# Patient Record
Sex: Female | Born: 1965 | Race: White | Hispanic: No | Smoking: Current every day smoker
Health system: Southern US, Community
[De-identification: ages and names within clinical notes are randomized; demographics above are authoritative.]

## PROBLEM LIST (undated history)

## (undated) DIAGNOSIS — Z720 Tobacco use: Secondary | ICD-10-CM

## (undated) DIAGNOSIS — N951 Menopausal and female climacteric states: Secondary | ICD-10-CM

## (undated) DIAGNOSIS — N879 Dysplasia of cervix uteri, unspecified: Secondary | ICD-10-CM

## (undated) DIAGNOSIS — L900 Lichen sclerosus et atrophicus: Secondary | ICD-10-CM

## (undated) DIAGNOSIS — M8580 Other specified disorders of bone density and structure, unspecified site: Secondary | ICD-10-CM

## (undated) DIAGNOSIS — E7800 Pure hypercholesterolemia, unspecified: Secondary | ICD-10-CM

## (undated) DIAGNOSIS — N8003 Adenomyosis of the uterus: Secondary | ICD-10-CM

## (undated) DIAGNOSIS — K6289 Other specified diseases of anus and rectum: Secondary | ICD-10-CM

## (undated) HISTORY — PX: ABDOMINAL HYSTERECTOMY: SHX81

## (undated) HISTORY — DX: Other specified diseases of anus and rectum: K62.89

## (undated) HISTORY — DX: Pure hypercholesterolemia, unspecified: E78.00

## (undated) HISTORY — DX: Menopausal and female climacteric states: N95.1

## (undated) HISTORY — DX: Adenomyosis of the uterus: N80.03

## (undated) HISTORY — DX: Dysplasia of cervix uteri, unspecified: N87.9

## (undated) HISTORY — DX: Other specified disorders of bone density and structure, unspecified site: M85.80

## (undated) HISTORY — PX: OTHER SURGICAL HISTORY: SHX169

## (undated) HISTORY — PX: TUBAL LIGATION: SHX77

## (undated) HISTORY — DX: Lichen sclerosus et atrophicus: L90.0

## (undated) HISTORY — DX: Tobacco use: Z72.0

---

## 2002-12-18 ENCOUNTER — Other Ambulatory Visit: Admission: RE | Admit: 2002-12-18 | Discharge: 2002-12-18 | Payer: Self-pay | Admitting: Obstetrics and Gynecology

## 2003-05-12 ENCOUNTER — Encounter: Payer: Self-pay | Admitting: Family Medicine

## 2003-05-12 ENCOUNTER — Encounter: Admission: RE | Admit: 2003-05-12 | Discharge: 2003-05-12 | Payer: Self-pay | Admitting: Family Medicine

## 2005-03-15 ENCOUNTER — Other Ambulatory Visit: Admission: RE | Admit: 2005-03-15 | Discharge: 2005-03-15 | Payer: Self-pay | Admitting: Obstetrics and Gynecology

## 2005-03-20 ENCOUNTER — Encounter: Admission: RE | Admit: 2005-03-20 | Discharge: 2005-03-20 | Payer: Self-pay | Admitting: Obstetrics and Gynecology

## 2006-07-13 ENCOUNTER — Encounter (INDEPENDENT_AMBULATORY_CARE_PROVIDER_SITE_OTHER): Payer: Self-pay | Admitting: Specialist

## 2006-07-13 ENCOUNTER — Ambulatory Visit (HOSPITAL_COMMUNITY): Admission: RE | Admit: 2006-07-13 | Discharge: 2006-07-13 | Payer: Self-pay | Admitting: Obstetrics and Gynecology

## 2008-05-20 ENCOUNTER — Ambulatory Visit (HOSPITAL_COMMUNITY): Admission: RE | Admit: 2008-05-20 | Discharge: 2008-05-21 | Payer: Self-pay | Admitting: Obstetrics and Gynecology

## 2008-05-20 ENCOUNTER — Encounter (INDEPENDENT_AMBULATORY_CARE_PROVIDER_SITE_OTHER): Payer: Self-pay | Admitting: Obstetrics and Gynecology

## 2010-12-18 ENCOUNTER — Encounter: Payer: Self-pay | Admitting: Obstetrics and Gynecology

## 2011-04-11 NOTE — Discharge Summary (Signed)
Brandi Carr, [place]            ACCOUNT NO.:  [id number]   MEDICAL RECORD NO.:  [id number]          PATIENT TYPE:  OIB   LOCATION:  9319                          FACILITY:  [facility]   PHYSICIAN:  Juluis Mire, M.D.   DATE OF BIRTH:  March 03, 1966   DATE OF ADMISSION:  05/20/2008  DATE OF DISCHARGE:  05/21/2008                               DISCHARGE SUMMARY   PREOP DIAGNOSIS:  Adenomyosis.   DISCHARGE DIAGNOSIS:  Adenomyosis.   PROCEDURE:  Laparoscopic-assisted vaginal hysterectomy.   For complete history and physical, please see dictated note course in  the hospital.  The patient underwent laparoscopic-assisted vaginal  hysterectomy.  Pathology is pending, postop did well, discharged home on  first postop day, at that time was afebrile with stable vital signs.  She was tolerating her diet and ambulating without difficulty.  Foley  had been discontinued.  She was able to void without difficulty.  She  had minimal vaginal bleeding on exam.  All incisions were intact.  Abdomen was soft, nontender, and bowel sounds were active.  Her postop  hemoglobin was 12.6.   In terms of complications, none were encountered during her stay in the  hospital. The patient was discharged home in stable condition.   DISPOSITION:  The patient is discharged home on Tylox as needed for  pain.  She is to avoid heavy lifting, vaginal instruments, or driving a  car.  She is to watch for signs of infection.  She will call with  nausea, vomiting, increased abdominal pain, or active vaginal bleeding.  Also counseled on the signs of deep venous thrombosis and pulmonary  embolus.  Plan to follow up in the office in 1 week.      Juluis Mire, M.D.  Electronically Signed     JSM/MEDQ  D:  05/21/2008  T:  05/21/2008  Job:  [id number]

## 2011-04-11 NOTE — Op Note (Signed)
NAME:  Brandi Carr, SMART            ACCOUNT NO.:  [id number]   MEDICAL RECORD NO.:  [id number]          PATIENT TYPE:  OIB   LOCATION:  9319                          FACILITY:  [facility]   PHYSICIAN:  Juluis Mire, M.D.   DATE OF BIRTH:  March 22, 1966   DATE OF PROCEDURE:  05/20/2008  DATE OF DISCHARGE:                               OPERATIVE REPORT   PREOPERATIVE DIAGNOSIS:  Uterine adenomyosis.   POSTOPERATIVE DIAGNOSIS:  Uterine adenomyosis.   PROCEDURE:  Laparoscopic-assisted vaginal hysterectomy.   SURGEON:  Juluis Mire, MD   ASSISTANT:  Stann Mainland. Grewal, MD   ANESTHESIA:  General endotracheal.   ESTIMATED BLOOD LOSS:  400 mL.   PACKS AND DRAINS:  None.   INTRAOPERATIVE BLOOD PLACED:  None.   COMPLICATIONS:  None.   INDICATIONS:  Dictated in history and physical.   PROCEDURE:  The patient taken to the OR, placed supine position.  After  satisfactory level of general endotracheal anesthesia was obtained, the  patient was placed in a dorsal lithotomy position using the Allen  stirrups.  The abdomen, perineum, and vagina were prepped out with  Betadine.  Bladder was emptied by in-and-out catheterization.  A Hulka  tenaculum was put in place on the cervix and secured.  The patient was  draped as a sterile field.  A subumbilical incision was made with a  knife.  Incision was extended through the subcutaneous tissue.  The  fascia was entered sharply.  Peritoneum was entered sharply.  There was  no evidence of any periumbilical adhesions.  An open laparoscopic trocar  was put in place and secured.  Laparoscope was then introduced.  Abdomen  was inflated with carbon dioxide.  Visualization revealed no evidence of  injury to adjacent organs.  A 5-mm trocar was put in place in the  suprapubic area under direct visualization.  Appendix was visualized and  noted to be normal.  The upper abdomen including the liver and tip of  the gallbladder were clear.  Uterus was within upper  limits and normal  size.  Tubes and ovaries unremarkable.  She did have a simple cyst of  the left ovary.  Next, using EndoSeal, we cauterized and incised the  right utero-ovarian pedicle, right tube and mesosalpinx and the right  round ligament.  We then went to the left side, cauterized and incised  the left utero-ovarian pedicle, left tube, and mesosalpinx as well as  the left round ligament.  We had good hemostasis and freeing of the  adnexa bilaterally.   We then went vaginally at this point.  The laparoscope was removed.  The  abdomen was deflated with carbon dioxide.  The Hulka tenaculum was taken  off the cervix.  A weighted speculum was placed in the vaginal vault.  The cul-de-sac was entered sharply.  Both uterosacral ligaments were  clamped, cut and suture ligated with 0 Vicryl.  Reflection of the  vaginal mucosa anteriorly was incised and the bladder was dissected  superiorly.  Paracervical tissues were clamped, cut and suture ligated  with 0 Vicryl.  Using a clamp, cut and tie technique  of suture ligature  of 0 Vicryl, the parametrium serially separated from the sides of the  uterus.  The vesicouterine space was identified and entered sharply.  Uterus was then flipped, remaining pedicles were clamped and cut.  Uterus and cervix were passed off the operative field.  The held  pedicles were secured with free ties of 0 Vicryl.  The uterosacral  plication was stitched with 0 Vicryl was put in place and secured.  The  vaginal mucosa was closed with figure-of-eight of 0 Monocryl.  A Foley  was placed to straight drain with retrieval of adequate amount of clear  urine.   The patient's legs were repositioned.  Laparoscope was reintroduced and  abdomen was reinflated with carbon dioxide.  We irrigated the pelvis,  had minimal bleeding from the vaginal cuff brought under control with  cautery using the EndoSeal.  At this point, we had excellent hemostasis.  No evidence of injury to  adjacent organs.  Urine output remained clear  and adequate.  The abdomen was de-inflated with carbon dioxide.  All  trocars removed.  The subumbilical fascia closed with figure-of-eights  of 0 Vicryl, skin with interrupted subcuticular of 4-0 Vicryl.  The  suprapubic incision was closed with Dermabond.  The patient was taken  out of dorsal lithotomy position.  Once alert and extubated, transferred  to recovery room in good condition.  Sponge, instrument and needle count  was reported correct by circulating nurse x2.  Foley catheter remained  clear at the time of closure.      Juluis Mire, M.D.  Electronically Signed     JSM/MEDQ  D:  05/20/2008  T:  05/21/2008  Job:  [id number]

## 2011-04-11 NOTE — H&P (Signed)
NAME:  Brandi Carr, Brandi Carr            ACCOUNT NO.:  [id number]   MEDICAL RECORD NO.:  [id number]          PATIENT TYPE:  OIB   LOCATION:  9319                          FACILITY:  [facility]   PHYSICIAN:  Juluis Mire, M.D.   DATE OF BIRTH:  10-Nov-1966   DATE OF ADMISSION:  05/20/2008  DATE OF DISCHARGE:                              HISTORY & PHYSICAL   HISTORY AND PHYSICAL:  The patient is a 45 year old gravida 2, para 2  female, who presents for laparoscopic-assisted vaginal hysterectomy.   For the past 6 months to 1 year, cycles have been every 2 weeks.  She  will bleed for up to 11 days.  Her hemoglobin has been relatively  stable.  She describes her cycles as heavy, changing pads and tampons  every 2-3 hours with clots, and increasing dysmenorrhea.  She has had  previous ultrasounds.  She also had a hysteroscopy and laparoscopy in  2007 with findings suggestive of adenomyosis.  Options were discussed.  Because of tobacco use, birth control pills were contraindicated.  Discussed the use of Depo-Provera or other less aggressive surgical  management.  The patient desires to proceed with surgical removal of the  uterus.  She wants ovaries left in place.   ALLERGIES:  No known drug allergies.   MEDICATIONS:  Amitriptyline that she uses intermittently.   PAST MEDICAL HISTORY:  Usual childhood diseases.   PAST SURGICAL HISTORY:  She has had 2 vaginal deliveries.  One bilateral  tubal ligation and previous laparoscopy and hysteroscopy.   SOCIAL HISTORY:  One-pack per day tobacco use.  No alcohol use.   FAMILY HISTORY:  Noncontributory.   REVIEW OF SYSTEMS:  Noncontributory.   PHYSICAL EXAMINATION:  VITAL SIGNS:  The patient is afebrile, stable  vital signs.  HEENT:  The patient is normocephalic.  Pupils equal, round, and reactive  to light and accommodation.  Extraocular movements are intact.  Sclerae  and conjunctivae are clear.  Oropharynx is clear.  NECK:  Without thyromegaly.  BREASTS:  No discrete masses.  LUNGS:  Clear.  CARDIOVASCULAR:  Regular rhythm and rate without murmurs or gallops.  ABDOMEN:  Benign.  No mass, organomegaly, or tenderness.  PELVIC:  Normal external genitalia.  Vaginal mucosa clear.  Cervix  unremarkable.  Uterus upper limits of normal size, boggy, consistent  with adenomyosis.  Adnexa unremarkable.  EXTREMITIES:  Trace edema.  NEUROLOGICAL:  Grossly within normal limits.   IMPRESSION:  Abnormal uterine bleeding secondary to uterine adenomyosis.   PLAN:  The patient to undergo laparoscopic-assisted vaginal  hysterectomy.  The risks of surgery have been explained including the  risk of infection.  The risk of hemorrhage could require a transfusion  with the risk of AIDS or hepatitis.  The risk of injury to adjacent  organs including bladder, bowel, or ureters that could require further  exploratory surgery.  The risk of deep venous thrombosis and pulmonary  embolus.  The patient expressed understanding of indications, risks, and  alternatives.      Juluis Mire, M.D.  Electronically Signed     JSM/MEDQ  D:  05/20/2008  T:  05/21/2008  Job:  [id number]

## 2011-04-14 NOTE — H&P (Signed)
NAME:  Brandi Carr, Brandi Carr            ACCOUNT NO.:  [id number]   MEDICAL RECORD NO.:  [id number]          PATIENT TYPE:  AMB   LOCATION:  SDC                           FACILITY:  [facility]   PHYSICIAN:  Juluis Mire, M.D.   DATE OF BIRTH:  05-Feb-1966   DATE OF ADMISSION:  DATE OF DISCHARGE:                                HISTORY & PHYSICAL   The patient is a 45 year old gravida 2, para 2, female who presents for  diagnostic laparoscopy with laser standby and hysteroscopy.   In relation to the present admission, cycles have basically been regular.  She describes increasing menstrual flow and increasing discomfort.  She  underwent a saline infusion ultrasound here in the office that did reveal  evidence of adenomyosis since there were several polyp-like outgrowths in  the endometrium.  Adnexa unremarkable.  In view of this, she now presents  for diagnostic laparoscopy with laser standby and hysteroscopy.   ALLERGIES:  No known drug allergies.   MEDICATIONS:  Ibuprofen.   PAST MEDICAL HISTORY:  The usual childhood disease without any significant  sequelae.  No previous surgical history.   OBSTETRICAL HISTORY:  She has had two vaginal deliveries.   FAMILY HISTORY:  Noncontributory.   SOCIAL HISTORY:  One pack per day tobacco use, occasional alcohol use.   REVIEW OF SYSTEMS:  Noncontributory.   PHYSICAL EXAMINATION:  VITAL SIGNS:  The patient is afebrile with stable  vital signs.  HEENT:  The patient is normocephalic.  Pupils equal, round and reactive to  light and accommodation.  Extraocular movements are intact.  Sclerae and  conjunctivae clear.  Oropharynx clear.  NECK:  Without thyromegaly.  BREASTS:  No discrete masses.  LUNGS:  Clear.  CARDIAC:  Regular rhythm and rate without murmurs or gallops.  ABDOMEN:  Benign.  No mass, organomegaly or tenderness.  PELVIC:  Normal external genitalia.  Vaginal mucosa clear.  Cervix  unremarkable.  Uterus upper limits of normal size,  slightly boggy.  Adnexa  unremarkable.  EXTREMITIES:  Trace edema.  NEUROLOGIC:  Grossly within normal limits.   IMPRESSION:  1. Menorrhagia, increasing dysmenorrhea.  2. Endometrial polyps.   PLAN:  The patient will undergo diagnostic laparoscopy to evaluate the  pelvic status as well as hysteroscopy.  The risks of surgery have been  discussed, including the risk of infection; the risk of hemorrhage that  could require transfusion with the risk of AIDS or hepatitis; the risk of  injury to adjacent organs including bladder, bowel and ureters, that could  require further exploratory surgery; the risk of deep venous thrombosis and  pulmonary embolus.  The patient expressed an understanding of the  indications and risks.      Juluis Mire, M.D.  Electronically Signed     JSM/MEDQ  D:  07/13/2006  T:  07/13/2006  Job:  [id number]

## 2011-04-14 NOTE — Op Note (Signed)
NAME:  Brandi Carr, Brandi Carr            ACCOUNT NO.:  [id number]   MEDICAL RECORD NO.:  [id number]          PATIENT TYPE:  AMB   LOCATION:  SDC                           [facility]ACILITY:  [facility]   PHYSICIAN:  Juluis Mire, M.D.   DATE OF BIRTH:  1966/05/04   DATE OF PROCEDURE:  07/13/2006  DATE OF DISCHARGE:                                 OPERATIVE REPORT   PREOPERATIVE DIAGNOSIS:  Abnormal uterine bleed and pelvic pain.   POSTOPERATIVE DIAGNOSIS:  Uterine adenomyosis.   PROCEDURES:  Cervical dilatation, hysteroscopy, multiple endometrial  biopsies along with curetting, diagnostic laparoscopy.   SURGEON:  Juluis Mire, M.D.   ANESTHESIA:  General.   ESTIMATED BLOOD LOSS:  Minima.   PACKS AND DRAINS:  None.   INTRAOPERATIVE BLOOD PLACED:  None.   COMPLICATIONS:  None.   INDICATIONS:  Per dictated History and Physical.   PROCEDURES:  The patient was taken to the OR and placed in the supine  position.  __________.  General anesthesia was obtained.  The patient was  placed in the dorsal lithotomy position using the Allen stirrups.  The  abdomen, peroneum and vagina were prepped with Betadine.  The patient then  draped out for hysteroscopy.  Speculum was placed in the vaginal vault.  The  cervix __________ with a single toothed tenaculum.  Uterus sounded to 8 cm.  Cervix surgically dilated to the size of 35 Pratt dilator.  Operative  hysteroscope was introduced.  Intrauterine cavity was distended using  sorbitol.  Visualization revealed a synechia on the left side.  This was  taken down using the loupe.  Otherwise, we did not really see any  endometrial polyps.  Endometrium looked smooth and unremarkable.  Multiple  biopsies were obtained along with curetted and sent for pathologic review.  Deficits of 100 cc.  There were no signs of perforations or complications.   The __________ was then put in place.  Single toothed tenaculum and speculum  were removed from the vagina.  Bladder was  then emptied by in and out  catheterization.   Subumbilical incision made with knife.  There were needles introduced in  abdominal cavity.  The abdomen was inflated with approximately 3 liters of  CO2.  The operating laparoscope was introduced.  A 5 mm trocar had been  placed in the suprapubic area under direct visualization.  There was no  evidence of injury to adjacent organs.  Upper abdomen including liver and  gallbladder were clear.  Appendix was visualized and noted to be  unremarkable.  Uterus was enlarged consistent with possible adenomyosis,  previous bilateral tubal ligation was noted.  Both ovaries were  unremarkable.  There was no evidence of pelvic pathology, specifically, no  endometriosis or adhesions.  The procedure was terminated at this point.  While trocar was removed, subumbilical incision was closed with interrupted  subcuticular 4-0 Vicryl.  Suprapubic incision closed with Dermabond.  The  __________ retractor was then removed.  The patient was taken out of the  dorsal lithotomy position, __________ and extubated.  Transferred to the  recovery room in good condition.  Sponge,  instrument and needle count  reported as correct by circulating nurse.      Juluis Mire, M.D.  Electronically Signed     JSM/MEDQ  D:  07/13/2006  T:  07/13/2006  Job:  [id number]

## 2011-08-24 LAB — CBC
Hemoglobin: 12.6
Hemoglobin: 15
MCHC: 34.3
MCHC: 34.3
MCV: 100
MCV: 99.4
RBC: 3.67 — ABNORMAL LOW
RBC: 4.4
RDW: 13.5
WBC: 18.2 — ABNORMAL HIGH

## 2012-04-08 ENCOUNTER — Other Ambulatory Visit: Payer: Self-pay | Admitting: Obstetrics and Gynecology

## 2012-04-08 DIAGNOSIS — R928 Other abnormal and inconclusive findings on diagnostic imaging of breast: Secondary | ICD-10-CM

## 2012-04-10 ENCOUNTER — Ambulatory Visit
Admission: RE | Admit: 2012-04-10 | Discharge: 2012-04-10 | Disposition: A | Discharge status: Home / Self Care | Payer: BC Managed Care – PPO | Source: Ambulatory Visit | Attending: Obstetrics and Gynecology | Admitting: Obstetrics and Gynecology

## 2012-04-10 DIAGNOSIS — R928 Other abnormal and inconclusive findings on diagnostic imaging of breast: Secondary | ICD-10-CM

## 2012-04-11 ENCOUNTER — Other Ambulatory Visit: Payer: Self-pay

## 2015-11-23 ENCOUNTER — Ambulatory Visit: Payer: Self-pay | Admitting: Podiatry

## 2015-12-07 ENCOUNTER — Ambulatory Visit: Payer: Self-pay | Admitting: Podiatry

## 2016-08-03 ENCOUNTER — Ambulatory Visit: Payer: Self-pay | Admitting: Podiatry

## 2016-08-10 ENCOUNTER — Ambulatory Visit: Payer: Self-pay | Admitting: Podiatry

## 2016-09-07 ENCOUNTER — Ambulatory Visit: Payer: Self-pay | Admitting: Podiatry

## 2016-11-28 ENCOUNTER — Ambulatory Visit: Payer: Self-pay | Admitting: Podiatry

## 2017-10-24 DIAGNOSIS — Z23 Encounter for immunization: Secondary | ICD-10-CM | POA: Diagnosis not present

## 2017-10-24 DIAGNOSIS — F411 Generalized anxiety disorder: Secondary | ICD-10-CM | POA: Diagnosis not present

## 2017-10-24 DIAGNOSIS — F322 Major depressive disorder, single episode, severe without psychotic features: Secondary | ICD-10-CM | POA: Diagnosis not present

## 2017-10-24 DIAGNOSIS — E7800 Pure hypercholesterolemia, unspecified: Secondary | ICD-10-CM | POA: Diagnosis not present

## 2018-06-07 DIAGNOSIS — E7800 Pure hypercholesterolemia, unspecified: Secondary | ICD-10-CM | POA: Diagnosis not present

## 2018-06-07 DIAGNOSIS — Z0000 Encounter for general adult medical examination without abnormal findings: Secondary | ICD-10-CM | POA: Diagnosis not present

## 2018-06-07 DIAGNOSIS — Z1159 Encounter for screening for other viral diseases: Secondary | ICD-10-CM | POA: Diagnosis not present

## 2018-08-29 DIAGNOSIS — Z1231 Encounter for screening mammogram for malignant neoplasm of breast: Secondary | ICD-10-CM | POA: Diagnosis not present

## 2018-08-29 DIAGNOSIS — Z01419 Encounter for gynecological examination (general) (routine) without abnormal findings: Secondary | ICD-10-CM | POA: Diagnosis not present

## 2018-08-29 DIAGNOSIS — Z6831 Body mass index (BMI) 31.0-31.9, adult: Secondary | ICD-10-CM | POA: Diagnosis not present

## 2018-12-04 DIAGNOSIS — R21 Rash and other nonspecific skin eruption: Secondary | ICD-10-CM | POA: Diagnosis not present

## 2018-12-20 DIAGNOSIS — L821 Other seborrheic keratosis: Secondary | ICD-10-CM | POA: Diagnosis not present

## 2018-12-20 DIAGNOSIS — B081 Molluscum contagiosum: Secondary | ICD-10-CM | POA: Diagnosis not present

## 2018-12-20 DIAGNOSIS — L738 Other specified follicular disorders: Secondary | ICD-10-CM | POA: Diagnosis not present

## 2019-01-10 DIAGNOSIS — L308 Other specified dermatitis: Secondary | ICD-10-CM | POA: Diagnosis not present

## 2019-01-10 DIAGNOSIS — B081 Molluscum contagiosum: Secondary | ICD-10-CM | POA: Diagnosis not present

## 2019-01-27 DIAGNOSIS — F322 Major depressive disorder, single episode, severe without psychotic features: Secondary | ICD-10-CM | POA: Diagnosis not present

## 2019-01-27 DIAGNOSIS — F17200 Nicotine dependence, unspecified, uncomplicated: Secondary | ICD-10-CM | POA: Diagnosis not present

## 2019-01-27 DIAGNOSIS — F411 Generalized anxiety disorder: Secondary | ICD-10-CM | POA: Diagnosis not present

## 2019-02-07 DIAGNOSIS — L308 Other specified dermatitis: Secondary | ICD-10-CM | POA: Diagnosis not present

## 2019-02-07 DIAGNOSIS — B081 Molluscum contagiosum: Secondary | ICD-10-CM | POA: Diagnosis not present

## 2019-03-03 DIAGNOSIS — L308 Other specified dermatitis: Secondary | ICD-10-CM | POA: Diagnosis not present

## 2019-03-08 DIAGNOSIS — J019 Acute sinusitis, unspecified: Secondary | ICD-10-CM | POA: Diagnosis not present

## 2019-03-08 DIAGNOSIS — J209 Acute bronchitis, unspecified: Secondary | ICD-10-CM | POA: Diagnosis not present

## 2019-03-26 ENCOUNTER — Ambulatory Visit (INDEPENDENT_AMBULATORY_CARE_PROVIDER_SITE_OTHER): Payer: BLUE CROSS/BLUE SHIELD | Admitting: Sports Medicine

## 2019-03-26 ENCOUNTER — Other Ambulatory Visit: Payer: Self-pay

## 2019-03-26 ENCOUNTER — Encounter: Payer: Self-pay | Admitting: Sports Medicine

## 2019-03-26 ENCOUNTER — Ambulatory Visit (INDEPENDENT_AMBULATORY_CARE_PROVIDER_SITE_OTHER): Payer: BLUE CROSS/BLUE SHIELD

## 2019-03-26 VITALS — BP 108/66 | HR 96 | Temp 97.7°F | Resp 16 | Ht 63.0 in | Wt 194.0 lb

## 2019-03-26 DIAGNOSIS — M21619 Bunion of unspecified foot: Secondary | ICD-10-CM

## 2019-03-26 DIAGNOSIS — M79672 Pain in left foot: Secondary | ICD-10-CM

## 2019-03-26 DIAGNOSIS — M21612 Bunion of left foot: Secondary | ICD-10-CM

## 2019-03-26 DIAGNOSIS — M79671 Pain in right foot: Secondary | ICD-10-CM | POA: Diagnosis not present

## 2019-03-26 NOTE — Progress Notes (Signed)
Subjective: Brandi Carr is a 53 y.o. female patient who presents to office for evaluation of Right=Left bunion pain. Patient complains of progressive pain especially over the last several years in both feet that starts as pain over the bump with direct pressure and range of motion; patient now has difficulty fitting shoes comfortably and reports her feet throb and hurt even when she is just standing on them. Ranks pain 9/10 and is now interferring with daily activities.  Patient has also tried wider shoes, cushions, pads, insoles icing ibuprofen and bunion splints without relief.  Patient denies any other pedal complaints.   Patient denies a history of diabetes and is not on any blood thinners.  Patient denies any significant family history or history of arthritides.  Patient is a smoker less than half a pack per day but denies any history of difficulties with healing from previous tubal ligation and hysterectomy procedures.  Review of Systems  Musculoskeletal: Positive for myalgias.  All other systems reviewed and are negative.    There are no active problems to display for this patient.   Current Outpatient Medications on File Prior to Visit  Medication Sig Dispense Refill  . albuterol (VENTOLIN HFA) 108 (90 Base) MCG/ACT inhaler USE 2 PUFFS 4 TIMES A DAY AS NEEDED    . ALPRAZolam (XANAX) 0.25 MG tablet Take 0.25 mg by mouth 2 (two) times daily as needed.    Marland Kitchen. azithromycin (ZITHROMAX) 250 MG tablet TAKE 2 TABLETS BY MOUTH TODAY, THEN TAKE 1 TABLET DAILY FOR 4 DAYS    . buPROPion (WELLBUTRIN XL) 150 MG 24 hr tablet     . CHANTIX CONTINUING MONTH PAK 1 MG tablet     . desonide (DESOWEN) 0.05 % ointment APPLY A THIN LAYER TO AFFECTED AREAS DAILY    . fluconazole (DIFLUCAN) 150 MG tablet TAKE 1 TABLET BY MOUTH FOR ONE TIME DOSE    . nystatin ointment (MYCOSTATIN) APPLY TO THE AFFFECTED AREAS ON THE SKIN DAILY    . rosuvastatin (CRESTOR) 10 MG tablet TAKE 1 TABLET BY MOUTH DAILY ON  MONDAY, WEDNESAY, AND FRIDAY    . sertraline (ZOLOFT) 100 MG tablet      No current facility-administered medications on file prior to visit.     Allergies  Allergen Reactions  . Amoxicillin Diarrhea    Objective:  General: Alert and oriented x3 in no acute distress  Dermatology: No open lesions bilateral lower extremities, no webspace macerations, no ecchymosis bilateral, all nails x 10 are well manicured.  Vascular: Dorsalis Pedis and Posterior Tibial pedal pulses 2/4, Capillary Fill Time 3 seconds, (+) pedal hair growth bilateral, no edema bilateral lower extremities, Temperature gradient within normal limits.  Neurology: Michaell CowingGross sensation intact via light touch bilateral.  Musculoskeletal: Mild tenderness with palpation both bunion deformities equally however clinically the left bunion is much larger and compared to the right, no limitation or crepitus with range of motion, deformity reducible, tracking not trackbound, there is no 1st ray hypermobility noted bilateral. Midtarsal, Subtalar joint, and ankle joint range of motion is within normal limits. On weightbearing exam, there is decreased 1st MTPJ rom left greater than right with functional limitus noted, there is medial arch collapse on weightbearing, rearfoot slight valgus, forefoot slight abduction with HAV deformity supported on ground with no second toe crossover deformity noted.   Gait: Non-Antalgic gait with increased medial arch collapse and pronatory influence noted on left greater than right foot with medial 1st MTPJ roll-off at toe-off, heel off within  normal limits.   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  No other acute findings.      Assessment and Plan: Problem List Items Addressed This Visit    None    Visit Diagnoses    Bunion    -  Primary   Relevant Orders   DG Foot Complete Right   DG Foot Complete  Left   Foot pain, bilateral           -Complete examination performed -Xrays reviewed -Discussed treatement options; discussed HAV deformity;conservative and  Surgical management; risks, benefits, alternatives discussed. All patient's questions answered. -Patient reports that she is strongly considering surgery since she has had this for years and has tried several conservative options -Recommend continue with good supportive shoes and inserts meanwhile.  -Patient to return to office 3 to 4 weeks for further discussion and surgery consultation or sooner if condition worsens.  Advised patient that she must do 1 foot at a time and thinks that she will be a good candidate to have the left bunion corrected first followed by the right bunion later this year since her pain is so significant.  Asencion Islam, DPM

## 2019-03-26 NOTE — Patient Instructions (Signed)
Bunion  A bunion is a bump on the base of the big toe that forms when the bones of the big toe joint move out of position. Bunions may be small at first, but they often get larger over time. They can make walking painful. What are the causes? A bunion may be caused by:  Wearing narrow or pointed shoes that force the big toe to press against the other toes.  Abnormal foot development that causes the foot to roll inward (pronate).  Changes in the foot that are caused by certain diseases, such as rheumatoid arthritis or polio.  A foot injury. What increases the risk? The following factors may make you more likely to develop this condition:  Wearing shoes that squeeze the toes together.  Having certain diseases, such as: ? Rheumatoid arthritis. ? Polio. ? Cerebral palsy.  Having family members who have bunions.  Being born with a foot deformity, such as flat feet or low arches.  Doing activities that put a lot of pressure on the feet, such as ballet dancing. What are the signs or symptoms? The main symptom of a bunion is a noticeable bump on the big toe. Other symptoms may include:  Pain.  Swelling around the big toe.  Redness and inflammation.  Thick or hardened skin on the big toe or between the toes.  Stiffness or loss of motion in the big toe.  Trouble with walking. How is this diagnosed? A bunion may be diagnosed based on your symptoms, medical history, and activities. You may have tests, such as:  X-rays. These allow your health care provider to check the position of the bones in your foot and look for damage to your joint. They also help your health care provider determine the severity of your bunion and the best way to treat it.  Joint aspiration. In this test, a sample of fluid is removed from the toe joint. This test may be done if you are in a lot of pain. It helps rule out diseases that cause painful swelling of the joints, such as arthritis. How is this  treated? Treatment depends on the severity of your symptoms. The goal of treatment is to relieve symptoms and prevent the bunion from getting worse. Your health care provider may recommend:  Wearing shoes that have a wide toe box.  Using bunion pads to cushion the affected area.  Taping your toes together to keep them in a normal position.  Placing a device inside your shoe (orthotics) to help reduce pressure on your toe joint.  Taking medicine to ease pain, inflammation, and swelling.  Applying heat or ice to the affected area.  Doing stretching exercises.  Surgery to remove scar tissue and move the toes back into their normal position. This treatment is rare. Follow these instructions at home: Managing pain, stiffness, and swelling   If directed, put ice on the painful area: ? Put ice in a plastic bag. ? Place a towel between your skin and the bag. ? Leave the ice on for 20 minutes, 2-3 times a day. Activity   If directed, apply heat to the affected area before you exercise. Use the heat source that your health care provider recommends, such as a moist heat pack or a heating pad. ? Place a towel between your skin and the heat source. ? Leave the heat on for 20-30 minutes. ? Remove the heat if your skin turns bright red. This is especially important if you are unable to feel pain,   heat, or cold. You may have a greater risk of getting burned.  Do exercises as told by your health care provider. General instructions  Support your toe joint with proper footwear, shoe padding, or taping as told by your health care provider.  Take over-the-counter and prescription medicines only as told by your health care provider.  Keep all follow-up visits as told by your health care provider. This is important. Contact a health care provider if your symptoms:  Get worse.  Do not improve in 2 weeks. Get help right away if you have:  Severe pain and trouble with walking. Summary  A  bunion is a bump on the base of the big toe that forms when the bones of the big toe joint move out of position.  Bunions can make walking painful.  Treatment depends on the severity of your symptoms.  Support your toe joint with proper footwear, shoe padding, or taping as told by your health care provider. This information is not intended to replace advice given to you by your health care provider. Make sure you discuss any questions you have with your health care provider. Document Released: 11/13/2005 Document Revised: 03/26/2018 Document Reviewed: 03/26/2018 Elsevier Interactive Patient Education  2019 Elsevier Inc.  

## 2019-03-26 NOTE — Progress Notes (Signed)
   Subjective:    Patient ID: Brandi Carr, female    DOB: 1966-08-01, 52 y.o.   MRN: [id number]  HPI    Review of Systems  Musculoskeletal: Positive for myalgias.  All other systems reviewed and are negative.      Objective:   Physical Exam        Assessment & Plan:

## 2019-03-27 ENCOUNTER — Ambulatory Visit (INDEPENDENT_AMBULATORY_CARE_PROVIDER_SITE_OTHER): Payer: BLUE CROSS/BLUE SHIELD

## 2019-03-27 DIAGNOSIS — M21611 Bunion of right foot: Secondary | ICD-10-CM

## 2019-04-17 ENCOUNTER — Ambulatory Visit (INDEPENDENT_AMBULATORY_CARE_PROVIDER_SITE_OTHER): Payer: BLUE CROSS/BLUE SHIELD | Admitting: Sports Medicine

## 2019-04-17 ENCOUNTER — Encounter: Payer: Self-pay | Admitting: Sports Medicine

## 2019-04-17 ENCOUNTER — Other Ambulatory Visit: Payer: Self-pay

## 2019-04-17 VITALS — Temp 97.3°F | Resp 16

## 2019-04-17 DIAGNOSIS — M79672 Pain in left foot: Secondary | ICD-10-CM | POA: Diagnosis not present

## 2019-04-17 DIAGNOSIS — M21619 Bunion of unspecified foot: Secondary | ICD-10-CM | POA: Diagnosis not present

## 2019-04-17 NOTE — Patient Instructions (Signed)
Pre-Operative Instructions  Congratulations, you have decided to take an important step towards improving your quality of life.  You can be assured that the doctors and staff at [facility] will be with you every step of the way.  Here are some important things you should know:  1. Plan to be at the surgery center/hospital at least 1 (one) hour prior to your scheduled time, unless otherwise directed by the surgical center/hospital staff.  You must have a responsible adult accompany you, remain during the surgery and drive you home.  Make sure you have directions to the surgical center/hospital to ensure you arrive on time. 2. If you are having surgery at [facility] or  hospitals, you will need a copy of your medical history and physical form from your family physician within one month prior to the date of surgery. We will give you a form for your primary physician to complete.  3. We make every effort to accommodate the date you request for surgery.  However, there are times where surgery dates or times have to be moved.  We will contact you as soon as possible if a change in schedule is required.   4. No aspirin/ibuprofen for one week before surgery.  If you are on aspirin, any non-steroidal anti-inflammatory medications (Mobic, Aleve, Ibuprofen) should not be taken seven (7) days prior to your surgery.  You make take Tylenol for pain prior to surgery.  5. Medications - If you are taking daily heart and blood pressure medications, seizure, reflux, allergy, asthma, anxiety, pain or diabetes medications, make sure you notify the surgery center/hospital before the day of surgery so they can tell you which medications you should take or avoid the day of surgery. 6. No food or drink after midnight the night before surgery unless directed otherwise by surgical center/hospital staff. 7. No alcoholic beverages 24-hours prior to surgery.  No smoking 24-hours prior or 24-hours after  surgery. 8. Wear loose pants or shorts. They should be loose enough to fit over bandages, boots, and casts. 9. Don't wear slip-on shoes. Sneakers are preferred. 10. Bring your boot with you to the surgery center/hospital.  Also bring crutches or a walker if your physician has prescribed it for you.  If you do not have this equipment, it will be provided for you after surgery. 11. If you have not been contacted by the surgery center/hospital by the day before your surgery, call to confirm the date and time of your surgery. 12. Leave-time from work may vary depending on the type of surgery you have.  Appropriate arrangements should be made prior to surgery with your employer. 13. Prescriptions will be provided immediately following surgery by your doctor.  Fill these as soon as possible after surgery and take the medication as directed. Pain medications will not be refilled on weekends and must be approved by the doctor. 14. Remove nail polish on the operative foot and avoid getting pedicures prior to surgery. 15. Wash the night before surgery.  The night before surgery wash the foot and leg well with water and the antibacterial soap provided. Be sure to pay special attention to beneath the toenails and in between the toes.  Wash for at least three (3) minutes. Rinse thoroughly with water and dry well with a towel.  Perform this wash unless told not to do so by your physician.  Enclosed: 1 Ice pack (please put in freezer the night before surgery)   1 Hibiclens skin cleaner     Pre-op instructions  If you have any questions regarding the instructions, please do not hesitate to call our office.  [place]: [address], [place], [place] [address] -- [phone]  [place]: [address] [place], [place] [address] -- [phone]  [place]: [address]  [place], [place] [address] -- [phone]  [place]: [address], [address], [place], [place] [address] -- [phone]  Website:  [email or web address] 

## 2019-04-17 NOTE — Progress Notes (Signed)
Subjective: Brandi Carr is a 53 y.o. female patient who returns to office for follow up evaluation of Right=Left bunion pain. Patient states that the pain is the same at the bunion wose with standing and after work, pain still is  9/10 with use of wider shoes and insoles. Patient denies any other pedal complaints. Denies any changes with medical history since last visit.   There are no active problems to display for this patient.   Current Outpatient Medications on File Prior to Visit  Medication Sig Dispense Refill  . albuterol (VENTOLIN HFA) 108 (90 Base) MCG/ACT inhaler USE 2 PUFFS 4 TIMES A DAY AS NEEDED    . ALPRAZolam (XANAX) 0.25 MG tablet Take 0.25 mg by mouth 2 (two) times daily as needed.    Marland Kitchen azithromycin (ZITHROMAX) 250 MG tablet TAKE 2 TABLETS BY MOUTH TODAY, THEN TAKE 1 TABLET DAILY FOR 4 DAYS    . buPROPion (WELLBUTRIN XL) 150 MG 24 hr tablet     . CHANTIX CONTINUING MONTH PAK 1 MG tablet     . desonide (DESOWEN) 0.05 % ointment APPLY A THIN LAYER TO AFFECTED AREAS DAILY    . fluconazole (DIFLUCAN) 150 MG tablet TAKE 1 TABLET BY MOUTH FOR ONE TIME DOSE    . nystatin ointment (MYCOSTATIN) APPLY TO THE AFFFECTED AREAS ON THE SKIN DAILY    . rosuvastatin (CRESTOR) 10 MG tablet TAKE 1 TABLET BY MOUTH DAILY ON MONDAY, WEDNESAY, AND FRIDAY    . sertraline (ZOLOFT) 100 MG tablet      No current facility-administered medications on file prior to visit.     Allergies  Allergen Reactions  . Amoxicillin Diarrhea    History reviewed. No pertinent family history.  Social History   Socioeconomic History  . Marital status: Legally Separated    Spouse name: Not on file  . Number of children: Not on file  . Years of education: Not on file  . Highest education level: Not on file  Occupational History  . Not on file  Social Needs  . Financial resource strain: Not on file  . Food insecurity:    Worry: Not on file    Inability: Not on file  . Transportation needs:   Medical: Not on file    Non-medical: Not on file  Tobacco Use  . Smoking status: Current Every Day Smoker    Packs/day: 0.50    Types: Cigarettes  . Smokeless tobacco: Never Used  Substance and Sexual Activity  . Alcohol use: Not on file  . Drug use: Not on file  . Sexual activity: Not on file  Lifestyle  . Physical activity:    Days per week: Not on file    Minutes per session: Not on file  . Stress: Not on file  Relationships  . Social connections:    Talks on phone: Not on file    Gets together: Not on file    Attends religious service: Not on file    Active member of club or organization: Not on file    Attends meetings of clubs or organizations: Not on file    Relationship status: Not on file  Other Topics Concern  . Not on file  Social History Narrative  . Not on file    History reviewed. No pertinent surgical history.  Objective:  General: Alert and oriented x3 in no acute distress  Dermatology: No open lesions bilateral lower extremities, no webspace macerations, no ecchymosis bilateral, all nails x 10 are well  manicured.  Vascular: Dorsalis Pedis and Posterior Tibial pedal pulses 2/4, Capillary Fill Time 3 seconds, (+) pedal hair growth bilateral, no edema bilateral lower extremities, Temperature gradient within normal limits.  Neurology: Gross sensation intact via light touch bilateral.  Musculoskeletal: Mild tenderness with palpation both bunion deformities equally however clinically the left bunion is much larger and compared to the right as previous noted, no limitation or crepitus with range of motion, deformity reducible, tracking not trackbound, there is no 1st ray hypermobility noted bilateral. Midtarsal, Subtalar joint, and ankle joint range of motion is within normal limits. 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   Assessment and Plan: Problem List Items Addressed This Visit    None    Visit Diagnoses    Bunion    -  Primary   Left foot pain          -Complete examination performed -Previous x-rays reviewed -re-Discussed treatement options; discussed HAV deformity;conservative and  Surgical management; risks, benefits, alternatives discussed. All patient's questions answered. -Patient opt for surgical management. Consent obtained for Left Mariel KanskyKalish Akin bunionectomy with internal screw fixation. Pre and Post op course explained. Risks, benefits, alternatives explained. No guarantees given or implied. Surgical booking slip submitted and provided patient with Surgical packet and info for [facility].  -To dispense wedge shoe post op  -Rx knee scooter -Patient to be NWB 4 weeks -Patient to be out of work on [employer] for 12 weeks.  -Patient to return to office after surgery or sooner if problems arise. Asencion Islamitorya Aleyza Salmi, DPM

## 2019-04-18 ENCOUNTER — Telehealth: Payer: Self-pay | Admitting: *Deleted

## 2019-04-18 NOTE — Telephone Encounter (Signed)
"  Dr. Marylene Land told me to give you a call to schedule my surgery."  Dr. Marylene Land does surgeries on Mondays.  Do you have a date you would like?  "I'd like to do it on June 22 or in July."  She can do it on May 19, 2019.  I'll get it scheduled.  Someone from the surgical center will give you a call the Friday before your surgery date.  They will give you your arrival time.  You need to register with the surgical center online, instructions are in the brochure that we gave you.

## 2019-05-12 ENCOUNTER — Telehealth: Payer: Self-pay | Admitting: Sports Medicine

## 2019-05-12 DIAGNOSIS — M21619 Bunion of unspecified foot: Secondary | ICD-10-CM

## 2019-05-12 NOTE — Telephone Encounter (Signed)
Patient would let to know about getting a knee scooter before her surgery.sto

## 2019-05-12 NOTE — Telephone Encounter (Signed)
Mel Order Knee Scooter for patient Thanks Dr. Cannon Kettle

## 2019-05-13 ENCOUNTER — Telehealth: Payer: Self-pay | Admitting: *Deleted

## 2019-05-13 NOTE — Telephone Encounter (Signed)
DOS 05/19/2019; CPT CODE: 53299 - DOUBLE OSTEOTOMY LEFT FOOT  BCBS: Effective Date - 11/27/2017 - 11/26/9998  In-Network    Max Per Benefit Period Year-to-Date Remaining  CoInsurance     Deductible  $2,000.00 [phone]  Out-Of-Pocket  $3,000.00 [phone]   In-Network Individual  [employer] Coinsurance  Not Applicable  41%

## 2019-05-14 NOTE — Addendum Note (Signed)
Addended by: Johnnye Lana A on: 05/14/2019 03:32 PM   Modules accepted: Orders

## 2019-05-18 ENCOUNTER — Other Ambulatory Visit: Payer: Self-pay | Admitting: Sports Medicine

## 2019-05-18 DIAGNOSIS — Z9889 Other specified postprocedural states: Secondary | ICD-10-CM

## 2019-05-18 NOTE — Progress Notes (Signed)
Post op meds entered  -Dr. Roma Bierlein 

## 2019-05-19 DIAGNOSIS — M21612 Bunion of left foot: Secondary | ICD-10-CM | POA: Diagnosis not present

## 2019-05-19 DIAGNOSIS — M2042 Other hammer toe(s) (acquired), left foot: Secondary | ICD-10-CM | POA: Diagnosis not present

## 2019-05-19 DIAGNOSIS — M25572 Pain in left ankle and joints of left foot: Secondary | ICD-10-CM | POA: Diagnosis not present

## 2019-05-19 DIAGNOSIS — E7800 Pure hypercholesterolemia, unspecified: Secondary | ICD-10-CM | POA: Diagnosis not present

## 2019-05-19 DIAGNOSIS — M205X2 Other deformities of toe(s) (acquired), left foot: Secondary | ICD-10-CM | POA: Diagnosis not present

## 2019-05-19 DIAGNOSIS — M2012 Hallux valgus (acquired), left foot: Secondary | ICD-10-CM | POA: Diagnosis not present

## 2019-05-19 MED ORDER — IBUPROFEN 800 MG PO TABS: 800.0000 mg | ORAL_TABLET | Freq: Three times a day (TID) | ORAL | 0 refills | Status: DC | PRN

## 2019-05-19 MED ORDER — HYDROCODONE-ACETAMINOPHEN 10-325 MG PO TABS: 1.0000 | ORAL_TABLET | Freq: Four times a day (QID) | ORAL | 0 refills | Status: AC | PRN

## 2019-05-19 MED ORDER — DOCUSATE SODIUM 100 MG PO CAPS: 100.0000 mg | ORAL_CAPSULE | Freq: Two times a day (BID) | ORAL | 0 refills | Status: DC

## 2019-05-19 MED ORDER — PROMETHAZINE HCL 25 MG PO TABS: 25.0000 mg | ORAL_TABLET | Freq: Three times a day (TID) | ORAL | 0 refills | Status: DC | PRN

## 2019-05-20 ENCOUNTER — Encounter: Payer: Self-pay | Admitting: Sports Medicine

## 2019-05-20 ENCOUNTER — Telehealth: Payer: Self-pay | Admitting: Sports Medicine

## 2019-05-20 ENCOUNTER — Telehealth: Payer: Self-pay | Admitting: *Deleted

## 2019-05-20 DIAGNOSIS — M21612 Bunion of left foot: Secondary | ICD-10-CM | POA: Diagnosis not present

## 2019-05-20 DIAGNOSIS — M79672 Pain in left foot: Secondary | ICD-10-CM | POA: Diagnosis not present

## 2019-05-20 NOTE — Telephone Encounter (Signed)
Post op phone call made to patient. Patient reports that she had a rough night. The pain medication was not helping at 1st. Patient reports that she is having a hard time keeping the ice pack on her foot. I advised patient to ice behind knee, continue with pain regimein, and elevate. I advised patient that is it normal for the foot to be"waking up" and feeling less numb. I advised to continue with Nonweightbearing and that we will follow up on her scooter order. Patient thanked me for my call. -Dr. Cannon Kettle

## 2019-05-20 NOTE — Telephone Encounter (Signed)
Patient called to check status of knee scooter was able to relay the information I had received.  She then stated that she believes she is having an allergic reaction to the pain medication she is itching all over and has hives.  I told patient to not take any more pain med and to start Benadryl and increased fluids patient stated understanding.  Will advise of Dr. Leeanne Rio directions.

## 2019-05-20 NOTE — Telephone Encounter (Signed)
Your instructions are correct she has motrin 800 that she can take and if pain is not relieved with motrin we can change to a different medication _Dr Cannon Kettle

## 2019-05-21 ENCOUNTER — Other Ambulatory Visit: Payer: Self-pay | Admitting: Sports Medicine

## 2019-05-21 MED ORDER — HYDROMORPHONE HCL 4 MG PO TABS: 4.0000 mg | ORAL_TABLET | Freq: Four times a day (QID) | ORAL | 0 refills | Status: AC | PRN

## 2019-05-21 NOTE — Telephone Encounter (Signed)
I have sent and Rx for Dilaudid to her pharmacy to have her to pick up to use in place of the Norco to see if she reacts better to this medication for pain control. -Dr Cannon Kettle

## 2019-05-21 NOTE — Progress Notes (Signed)
Changed Norco to Diladud due to allergic reaction of itching and hives -Dr. Cannon Kettle

## 2019-05-21 NOTE — Telephone Encounter (Signed)
Patient called back and stated that she is taking ibuprofen and benadryl as instructed but is still very uncomfortable would like to know if there is something else she can take

## 2019-05-28 ENCOUNTER — Ambulatory Visit (INDEPENDENT_AMBULATORY_CARE_PROVIDER_SITE_OTHER): Payer: Self-pay | Admitting: Sports Medicine

## 2019-05-28 ENCOUNTER — Ambulatory Visit (INDEPENDENT_AMBULATORY_CARE_PROVIDER_SITE_OTHER): Payer: BC Managed Care – PPO

## 2019-05-28 ENCOUNTER — Encounter: Payer: Self-pay | Admitting: Sports Medicine

## 2019-05-28 ENCOUNTER — Other Ambulatory Visit: Payer: Self-pay | Admitting: Sports Medicine

## 2019-05-28 ENCOUNTER — Other Ambulatory Visit: Payer: Self-pay

## 2019-05-28 VITALS — Temp 99.1°F | Resp 16

## 2019-05-28 DIAGNOSIS — M21612 Bunion of left foot: Secondary | ICD-10-CM

## 2019-05-28 DIAGNOSIS — Z9889 Other specified postprocedural states: Secondary | ICD-10-CM

## 2019-05-28 DIAGNOSIS — M79672 Pain in left foot: Secondary | ICD-10-CM

## 2019-05-28 DIAGNOSIS — M21619 Bunion of unspecified foot: Secondary | ICD-10-CM

## 2019-05-28 MED ORDER — IBUPROFEN 800 MG PO TABS: 800.0000 mg | ORAL_TABLET | Freq: Three times a day (TID) | ORAL | 0 refills | Status: DC | PRN

## 2019-05-29 ENCOUNTER — Encounter: Payer: Self-pay | Admitting: Sports Medicine

## 2019-05-29 NOTE — Progress Notes (Signed)
Subjective: Brandi Carr is a 53 y.o. female patient seen today in office for POV #1 (DOS 05/19/2019), S/P left [name] shaking bunionectomy with internal fixation. Patient admits 4 out of 10 pain at surgical site with some numbness to the tip of the toe, denies calf pain, denies headache, chest pain, shortness of breath, nausea, vomiting, fever, or chills. Patient states that she is doing well and is only taking Ibuprofen for now and Dilaudid only took 1 reports that the itching has slowly gotten better after stopping the hydrocodone however still has some baseline itching that she is taking Benadryl for. No other issues noted.   There are no active problems to display for this patient.   Current Outpatient Medications on File Prior to Visit  Medication Sig Dispense Refill  . albuterol (VENTOLIN HFA) 108 (90 Base) MCG/ACT inhaler USE 2 PUFFS 4 TIMES A DAY AS NEEDED    . ALPRAZolam (XANAX) 0.25 MG tablet Take 0.25 mg by mouth 2 (two) times daily as needed.    Marland Kitchen azithromycin (ZITHROMAX) 250 MG tablet TAKE 2 TABLETS BY MOUTH TODAY, THEN TAKE 1 TABLET DAILY FOR 4 DAYS    . buPROPion (WELLBUTRIN XL) 150 MG 24 hr tablet     . CHANTIX CONTINUING MONTH PAK 1 MG tablet     . desonide (DESOWEN) 0.05 % ointment APPLY A THIN LAYER TO AFFECTED AREAS DAILY    . docusate sodium (COLACE) 100 MG capsule Take 1 capsule (100 mg total) by mouth 2 (two) times daily. 10 capsule 0  . fluconazole (DIFLUCAN) 150 MG tablet TAKE 1 TABLET BY MOUTH FOR ONE TIME DOSE    . nystatin ointment (MYCOSTATIN) APPLY TO THE AFFFECTED AREAS ON THE SKIN DAILY    . promethazine (PHENERGAN) 25 MG tablet Take 1 tablet (25 mg total) by mouth every 8 (eight) hours as needed for nausea or vomiting. 20 tablet 0  . rosuvastatin (CRESTOR) 10 MG tablet TAKE 1 TABLET BY MOUTH DAILY ON MONDAY, [place], AND FRIDAY    . sertraline (ZOLOFT) 100 MG tablet      No current facility-administered medications on file prior to visit.     Allergies   Allergen Reactions  . Amoxicillin Diarrhea    Objective: There were no vitals filed for this visit.  General: No acute distress, AAOx3  Left foot: Sutures intact with no gapping or dehiscence at surgical site, mild swelling to left forefoot, no erythema, no warmth, no drainage, no signs of infection noted, Capillary fill time <3 seconds in all digits, gross sensation present via light touch to left foot.  No pain or crepitation with range of motion left foot.  No pain with calf compression.   Post Op Xray, left foot: First toe and metatarsal in excellent alignment and position. Osteotomy site healing. Hardware intact. Soft tissue swelling within normal limits for post op status.   Assessment and Plan:  Problem List Items Addressed This Visit    None    Visit Diagnoses    S/P foot surgery, left    -  Primary   Bunion       Left foot pain           -Patient seen and evaluated -Applied dry sterile dressing to surgical site left foot secured with ACE wrap and stockinet  -Advised patient to make sure to keep dressings clean, dry, and intact to left surgical site and may adjust Ace wrap as needed -Advised patient to continue with wedge postop shoe and  knee scooter nonweightbearing to the left forefoot -Advised patient to limit activity to necessity  -Advised patient to ice and elevate as instructed -Continue with Motrin as needed for pain relief; refilled at today's visit -Continue with Benadryl as needed for itching advised patient that instead of it being a reaction to the pain medication it likely could have been a reaction after anesthesia -Will plan for possible suture removal at next office visit. In the meantime, patient to call office if any issues or problems arise.   Asencion Islamitorya Alicen Donalson, DPM

## 2019-06-03 ENCOUNTER — Other Ambulatory Visit: Payer: Self-pay | Admitting: Sports Medicine

## 2019-06-03 ENCOUNTER — Other Ambulatory Visit: Payer: BLUE CROSS/BLUE SHIELD

## 2019-06-03 DIAGNOSIS — M21619 Bunion of unspecified foot: Secondary | ICD-10-CM

## 2019-06-03 DIAGNOSIS — Z9889 Other specified postprocedural states: Secondary | ICD-10-CM

## 2019-06-04 ENCOUNTER — Encounter: Payer: Self-pay | Admitting: Sports Medicine

## 2019-06-04 ENCOUNTER — Encounter: Payer: BLUE CROSS/BLUE SHIELD | Admitting: Sports Medicine

## 2019-06-04 ENCOUNTER — Ambulatory Visit (INDEPENDENT_AMBULATORY_CARE_PROVIDER_SITE_OTHER): Payer: Self-pay | Admitting: Sports Medicine

## 2019-06-04 ENCOUNTER — Other Ambulatory Visit: Payer: Self-pay

## 2019-06-04 VITALS — Temp 96.9°F | Resp 16

## 2019-06-04 DIAGNOSIS — M21619 Bunion of unspecified foot: Secondary | ICD-10-CM

## 2019-06-04 DIAGNOSIS — M79672 Pain in left foot: Secondary | ICD-10-CM

## 2019-06-04 DIAGNOSIS — Z9889 Other specified postprocedural states: Secondary | ICD-10-CM

## 2019-06-04 NOTE — Progress Notes (Signed)
Subjective: Brandi Carr is a 53 y.o. female patient seen today in office for POV #2 (DOS 05/19/2019), S/P left Kalish-akin bunionectomy with internal fixation. Patient admits shooting and spasm pain, denies calf pain, denies headache, chest pain, shortness of breath, nausea, vomiting, fever, or chills. Patient states that she is doing well and is only taking Ibuprofen. No other issues noted.   There are no active problems to display for this patient.   Current Outpatient Medications on File Prior to Visit  Medication Sig Dispense Refill  . albuterol (VENTOLIN HFA) 108 (90 Base) MCG/ACT inhaler USE 2 PUFFS 4 TIMES A DAY AS NEEDED    . ALPRAZolam (XANAX) 0.25 MG tablet Take 0.25 mg by mouth 2 (two) times daily as needed.    Marland Kitchen azithromycin (ZITHROMAX) 250 MG tablet TAKE 2 TABLETS BY MOUTH TODAY, THEN TAKE 1 TABLET DAILY FOR 4 DAYS    . buPROPion (WELLBUTRIN XL) 150 MG 24 hr tablet     . CHANTIX CONTINUING MONTH PAK 1 MG tablet     . desonide (DESOWEN) 0.05 % ointment APPLY A THIN LAYER TO AFFECTED AREAS DAILY    . docusate sodium (COLACE) 100 MG capsule Take 1 capsule (100 mg total) by mouth 2 (two) times daily. 10 capsule 0  . fluconazole (DIFLUCAN) 150 MG tablet TAKE 1 TABLET BY MOUTH FOR ONE TIME DOSE    . ibuprofen (ADVIL) 800 MG tablet Take 1 tablet (800 mg total) by mouth every 8 (eight) hours as needed. For mild pain or for pain unrelieved by pain medication/break through pain 30 tablet 0  . nystatin ointment (MYCOSTATIN) APPLY TO THE AFFFECTED AREAS ON THE SKIN DAILY    . promethazine (PHENERGAN) 25 MG tablet Take 1 tablet (25 mg total) by mouth every 8 (eight) hours as needed for nausea or vomiting. 20 tablet 0  . rosuvastatin (CRESTOR) 10 MG tablet TAKE 1 TABLET BY MOUTH DAILY ON MONDAY, [place], AND FRIDAY    . sertraline (ZOLOFT) 100 MG tablet      No current facility-administered medications on file prior to visit.     Allergies  Allergen Reactions  . Amoxicillin Diarrhea     Objective: There were no vitals filed for this visit.  General: No acute distress, AAOx3  Left foot: Sutures intact with minimal gapping but no dehiscence at surgical site, mild swelling to left forefoot, no erythema, no warmth, no drainage, no signs of infection noted, Capillary fill time <3 seconds in all digits, gross sensation present via light touch to left foot.  No pain or crepitation with range of motion left foot.  No pain with calf compression.   Assessment and Plan:  Problem List Items Addressed This Visit    None    Visit Diagnoses    S/P foot surgery, left    -  Primary   Bunion       Left foot pain           -Patient seen and evaluated -Applied dry sterile dressing to surgical site left foot secured with ACE wrap and stockinet  -Advised patient to make sure to keep dressings clean, dry, and intact to left surgical site and may adjust Ace wrap as needed for this week and may change dressing next week since it will be 2 weeks before I see her again since she wants to spend a week with her daughter of which I ok'd as long as she can take her scooter and shoe -Advised patient to continue  with wedge postop shoe and knee scooter nonweightbearing to the left forefoot -Advised patient to limit activity to necessity  -Advised patient to ice and elevate as instructed -Continue with Motrin as needed for pain relief -Will plan for xray, suture removal, and weightbearing with CAM boot at next office visit. In the meantime, patient to call office if any issues or problems arise.   Asencion Islamitorya Farris Blash, DPM

## 2019-06-19 ENCOUNTER — Other Ambulatory Visit: Payer: Self-pay

## 2019-06-19 ENCOUNTER — Ambulatory Visit (INDEPENDENT_AMBULATORY_CARE_PROVIDER_SITE_OTHER): Payer: BC Managed Care – PPO

## 2019-06-19 ENCOUNTER — Encounter: Payer: Self-pay | Admitting: Sports Medicine

## 2019-06-19 ENCOUNTER — Ambulatory Visit (INDEPENDENT_AMBULATORY_CARE_PROVIDER_SITE_OTHER): Payer: BC Managed Care – PPO | Admitting: Sports Medicine

## 2019-06-19 ENCOUNTER — Other Ambulatory Visit: Payer: Self-pay | Admitting: Sports Medicine

## 2019-06-19 VITALS — Temp 98.1°F | Resp 16

## 2019-06-19 DIAGNOSIS — Z9889 Other specified postprocedural states: Secondary | ICD-10-CM

## 2019-06-19 DIAGNOSIS — M21619 Bunion of unspecified foot: Secondary | ICD-10-CM

## 2019-06-19 DIAGNOSIS — M21612 Bunion of left foot: Secondary | ICD-10-CM | POA: Diagnosis not present

## 2019-06-19 DIAGNOSIS — M79672 Pain in left foot: Secondary | ICD-10-CM

## 2019-06-19 NOTE — Progress Notes (Signed)
Subjective: Brandi Carr is a 53 y.o. female patient seen today in office for POV #3 (DOS 05/19/2019), S/P left Kalish-akin bunionectomy with internal fixation. Patient reports that she has decreased spasms and shooting pain, pain 2-3 out of 10 denies calf pain, denies headache, chest pain, shortness of breath, nausea, vomiting, fever, or chills. No other issues noted.   There are no active problems to display for this patient.   Current Outpatient Medications on File Prior to Visit  Medication Sig Dispense Refill  . albuterol (VENTOLIN HFA) 108 (90 Base) MCG/ACT inhaler USE 2 PUFFS 4 TIMES A DAY AS NEEDED    . ALPRAZolam (XANAX) 0.25 MG tablet Take 0.25 mg by mouth 2 (two) times daily as needed.    Marland Kitchen azithromycin (ZITHROMAX) 250 MG tablet TAKE 2 TABLETS BY MOUTH TODAY, THEN TAKE 1 TABLET DAILY FOR 4 DAYS    . buPROPion (WELLBUTRIN XL) 150 MG 24 hr tablet     . CHANTIX CONTINUING MONTH PAK 1 MG tablet     . desonide (DESOWEN) 0.05 % ointment APPLY A THIN LAYER TO AFFECTED AREAS DAILY    . docusate sodium (COLACE) 100 MG capsule Take 1 capsule (100 mg total) by mouth 2 (two) times daily. 10 capsule 0  . fluconazole (DIFLUCAN) 150 MG tablet TAKE 1 TABLET BY MOUTH FOR ONE TIME DOSE    . ibuprofen (ADVIL) 800 MG tablet Take 1 tablet (800 mg total) by mouth every 8 (eight) hours as needed. For mild pain or for pain unrelieved by pain medication/break through pain 30 tablet 0  . nystatin ointment (MYCOSTATIN) APPLY TO THE AFFFECTED AREAS ON THE SKIN DAILY    . promethazine (PHENERGAN) 25 MG tablet Take 1 tablet (25 mg total) by mouth every 8 (eight) hours as needed for nausea or vomiting. 20 tablet 0  . rosuvastatin (CRESTOR) 10 MG tablet TAKE 1 TABLET BY MOUTH DAILY ON MONDAY, [place], AND FRIDAY    . sertraline (ZOLOFT) 100 MG tablet      No current facility-administered medications on file prior to visit.     Allergies  Allergen Reactions  . Amoxicillin Diarrhea    Objective: There  were no vitals filed for this visit.  General: No acute distress, AAOx3  Left foot: Sutures intact with minimal gapping still but no complete dehiscence at surgical site, mild swelling to left forefoot, no erythema, no warmth, no drainage, no signs of infection noted, Capillary fill time <3 seconds in all digits, gross sensation present via light touch to left foot.  No pain or crepitation with range of motion left foot.  No pain with calf compression.   Assessment and Plan:  Problem List Items Addressed This Visit    None    Visit Diagnoses    S/P foot surgery, left    -  Primary   Bunion       Left foot pain          -Patient seen and evaluated -Reapply Steri-Strips -Applied dry sterile dressing to surgical site left foot secured with ACE wrap and stockinet  -Advised patient to keep dressing intact however if needs to may change every other day -Advised patient that her incision may be slow to heal because she is currently a smoker and should consider cutting back -Dispensed cam boot -Advised patient to limit activity to necessity  -Advised patient to ice and elevate as instructed -Continue with Motrin as needed for pain relief -Will plan for incision check at next office  visit. In the meantime, patient to call office if any issues or problems arise.   Asencion Islam, DPM

## 2019-06-26 ENCOUNTER — Encounter: Payer: BC Managed Care – PPO | Admitting: Sports Medicine

## 2019-06-27 ENCOUNTER — Ambulatory Visit (INDEPENDENT_AMBULATORY_CARE_PROVIDER_SITE_OTHER): Payer: BC Managed Care – PPO | Admitting: Sports Medicine

## 2019-06-27 ENCOUNTER — Encounter: Payer: Self-pay | Admitting: Sports Medicine

## 2019-06-27 ENCOUNTER — Other Ambulatory Visit: Payer: Self-pay

## 2019-06-27 DIAGNOSIS — M21619 Bunion of unspecified foot: Secondary | ICD-10-CM

## 2019-06-27 DIAGNOSIS — Z9889 Other specified postprocedural states: Secondary | ICD-10-CM

## 2019-06-27 DIAGNOSIS — M79672 Pain in left foot: Secondary | ICD-10-CM

## 2019-06-27 NOTE — Progress Notes (Signed)
Subjective: Brandi Carr is a 53 y.o. female patient seen today in office for POV #4 (DOS 05/19/2019), S/P left Kalish-akin bunionectomy with internal fixation. Patient reports that since being in the boot she has had increased swelling and pain in her knee since Saturday denies any significant pain in the foot but has been taking ibuprofen icing elevating as instructed and has been using 1 crutch since her knee has been bothering her, denies headache, chest pain, shortness of breath, nausea, vomiting, fever, or chills. No other issues noted.   There are no active problems to display for this patient.   Current Outpatient Medications on File Prior to Visit  Medication Sig Dispense Refill  . albuterol (VENTOLIN HFA) 108 (90 Base) MCG/ACT inhaler USE 2 PUFFS 4 TIMES A DAY AS NEEDED    . ALPRAZolam (XANAX) 0.25 MG tablet Take 0.25 mg by mouth 2 (two) times daily as needed.    Marland Kitchen azithromycin (ZITHROMAX) 250 MG tablet TAKE 2 TABLETS BY MOUTH TODAY, THEN TAKE 1 TABLET DAILY FOR 4 DAYS    . buPROPion (WELLBUTRIN XL) 150 MG 24 hr tablet     . CHANTIX CONTINUING MONTH PAK 1 MG tablet     . desonide (DESOWEN) 0.05 % ointment APPLY A THIN LAYER TO AFFECTED AREAS DAILY    . docusate sodium (COLACE) 100 MG capsule Take 1 capsule (100 mg total) by mouth 2 (two) times daily. 10 capsule 0  . fluconazole (DIFLUCAN) 150 MG tablet TAKE 1 TABLET BY MOUTH FOR ONE TIME DOSE    . ibuprofen (ADVIL) 800 MG tablet Take 1 tablet (800 mg total) by mouth every 8 (eight) hours as needed. For mild pain or for pain unrelieved by pain medication/break through pain 30 tablet 0  . nystatin ointment (MYCOSTATIN) APPLY TO THE AFFFECTED AREAS ON THE SKIN DAILY    . promethazine (PHENERGAN) 25 MG tablet Take 1 tablet (25 mg total) by mouth every 8 (eight) hours as needed for nausea or vomiting. 20 tablet 0  . rosuvastatin (CRESTOR) 10 MG tablet TAKE 1 TABLET BY MOUTH DAILY ON MONDAY, [place], AND FRIDAY    . sertraline (ZOLOFT)  100 MG tablet      No current facility-administered medications on file prior to visit.     Allergies  Allergen Reactions  . Amoxicillin Diarrhea    Objective: There were no vitals filed for this visit.  General: No acute distress, AAOx3  Left foot: Sutures intact with now healed incision no gapping no dehiscence at surgical site, mild swelling to left forefoot, no erythema, no warmth, no drainage, no signs of infection noted, Capillary fill time <3 seconds in all digits, gross sensation present via light touch to left foot.  No pain or crepitation with range of motion left foot.  No pain with calf compression.  Localized swelling to the knee without any warmth likely from patient overusing the knee and not walking properly in boot.  Assessment and Plan:  Problem List Items Addressed This Visit    None    Visit Diagnoses    S/P foot surgery, left    -  Primary   Bunion       Left foot pain          -Patient seen and evaluated -Sutures removed patient may shower on tomorrow and allow Steri-Strips to fall off on their own when the strips fall off and the skin starts to peel advised patient to apply antibiotic cream and may use Ace wrap  for edema control as needed -Exchange cam boot for postop shoe since patient is struggling with ambulation with boot to avoid worsening of her knee -Advised patient to limit activity to necessity  -Advised patient to ice and elevate as instructed -Continue with Motrin as needed for pain relief -Will plan for x-ray and slowly transition patient to tennis shoe at next office visit. In the meantime, patient to call office if any issues or problems arise.   Asencion Islamitorya Amos Gaber, DPM

## 2019-07-11 ENCOUNTER — Other Ambulatory Visit: Payer: Self-pay | Admitting: Sports Medicine

## 2019-07-11 ENCOUNTER — Other Ambulatory Visit: Payer: Self-pay

## 2019-07-11 ENCOUNTER — Ambulatory Visit (INDEPENDENT_AMBULATORY_CARE_PROVIDER_SITE_OTHER): Payer: BC Managed Care – PPO

## 2019-07-11 ENCOUNTER — Ambulatory Visit (INDEPENDENT_AMBULATORY_CARE_PROVIDER_SITE_OTHER): Payer: BC Managed Care – PPO | Admitting: Sports Medicine

## 2019-07-11 ENCOUNTER — Encounter: Payer: Self-pay | Admitting: Sports Medicine

## 2019-07-11 VITALS — Temp 98.6°F | Resp 16

## 2019-07-11 DIAGNOSIS — M21612 Bunion of left foot: Secondary | ICD-10-CM

## 2019-07-11 DIAGNOSIS — Z9889 Other specified postprocedural states: Secondary | ICD-10-CM

## 2019-07-11 DIAGNOSIS — M21619 Bunion of unspecified foot: Secondary | ICD-10-CM

## 2019-07-11 DIAGNOSIS — L309 Dermatitis, unspecified: Secondary | ICD-10-CM

## 2019-07-11 DIAGNOSIS — M79672 Pain in left foot: Secondary | ICD-10-CM

## 2019-07-11 NOTE — Progress Notes (Signed)
Subjective: Brandi Carr is a 53 y.o. female patient seen today in office for POV #5 (DOS 05/19/2019), S/P left Kalish-akin bunionectomy with internal fixation. Patient reports that she has some swelling and pain along the incision that is a burning sensation with spasming states that sometimes the pain is 5 out of 10 reports that she did notice her skin blistering up with a little rash so stopped using Neosporin has switched over to Vaseline patient is taking ibuprofen as needed and ambulating with use of a surgical shoe.  Patient denies any other symptoms like headache chest pain shortness of breath nausea vomiting fever or chills.  No other issues noted.   There are no active problems to display for this patient.   Current Outpatient Medications on File Prior to Visit  Medication Sig Dispense Refill  . albuterol (VENTOLIN HFA) 108 (90 Base) MCG/ACT inhaler USE 2 PUFFS 4 TIMES A DAY AS NEEDED    . ALPRAZolam (XANAX) 0.25 MG tablet Take 0.25 mg by mouth 2 (two) times daily as needed.    Marland Kitchen. azithromycin (ZITHROMAX) 250 MG tablet TAKE 2 TABLETS BY MOUTH TODAY, THEN TAKE 1 TABLET DAILY FOR 4 DAYS    . buPROPion (WELLBUTRIN XL) 150 MG 24 hr tablet     . CHANTIX CONTINUING MONTH PAK 1 MG tablet     . desonide (DESOWEN) 0.05 % ointment APPLY A THIN LAYER TO AFFECTED AREAS DAILY    . docusate sodium (COLACE) 100 MG capsule Take 1 capsule (100 mg total) by mouth 2 (two) times daily. 10 capsule 0  . fluconazole (DIFLUCAN) 150 MG tablet TAKE 1 TABLET BY MOUTH FOR ONE TIME DOSE    . ibuprofen (ADVIL) 800 MG tablet Take 1 tablet (800 mg total) by mouth every 8 (eight) hours as needed. For mild pain or for pain unrelieved by pain medication/break through pain 30 tablet 0  . nystatin ointment (MYCOSTATIN) APPLY TO THE AFFFECTED AREAS ON THE SKIN DAILY    . promethazine (PHENERGAN) 25 MG tablet Take 1 tablet (25 mg total) by mouth every 8 (eight) hours as needed for nausea or vomiting. 20 tablet 0  .  rosuvastatin (CRESTOR) 10 MG tablet TAKE 1 TABLET BY MOUTH DAILY ON MONDAY, [place], AND FRIDAY    . sertraline (ZOLOFT) 100 MG tablet      No current facility-administered medications on file prior to visit.     Allergies  Allergen Reactions  . Amoxicillin Diarrhea    Objective: There were no vitals filed for this visit.  General: No acute distress, AAOx3  Left foot: Incision well-healed, there is pinpoint lesions that are raised and red along the incision likely consistent with an allergic reaction to use of Neosporin, mild swelling to left forefoot, no erythema, no warmth, no drainage, no other signs of infection noted, Capillary fill time <3 seconds in all digits, gross sensation present via light touch to left foot.  No pain or crepitation with range of motion left foot normal guarding and mild burning pain consistent with postop status.  No pain with calf compression.   X-ray left foot hardware intact consistent with postoperative status osteotomy sites appear to be healing well with no acute concerns.  Assessment and Plan:  Problem List Items Addressed This Visit    None    Visit Diagnoses    Dermatitis    -  Primary   S/P foot surgery, left       Bunion       Left  foot pain          -Patient seen and evaluated -X-rays reviewed -Advised patient to discontinue with use of Neosporin and may use Vaseline or hydrocortisone cream to the area if there is persistent burning or itchiness may take Benadryl to also help alleviate this allergic reaction to the surgical site from use of Neosporin -Advised patient now she may slowly transition out of postop shoe to normal shoe as tolerated -Advised patient to limit activity to tolerance -Advised patient to ice and elevate as instructed and may continue with Ace wrap as needed for edema control -Continue with Motrin as needed for pain relief -Will plan for x-ray and discussing return to work at next visit.  In the meantime patient to  call if there is any acute problems or issues that arise.   Landis Martins, DPM

## 2019-07-25 ENCOUNTER — Ambulatory Visit (INDEPENDENT_AMBULATORY_CARE_PROVIDER_SITE_OTHER): Payer: BC Managed Care – PPO

## 2019-07-25 ENCOUNTER — Other Ambulatory Visit: Payer: Self-pay | Admitting: Sports Medicine

## 2019-07-25 ENCOUNTER — Other Ambulatory Visit: Payer: Self-pay

## 2019-07-25 ENCOUNTER — Encounter: Payer: Self-pay | Admitting: Sports Medicine

## 2019-07-25 ENCOUNTER — Ambulatory Visit (INDEPENDENT_AMBULATORY_CARE_PROVIDER_SITE_OTHER): Payer: BC Managed Care – PPO | Admitting: Sports Medicine

## 2019-07-25 DIAGNOSIS — M79672 Pain in left foot: Secondary | ICD-10-CM

## 2019-07-25 DIAGNOSIS — M21619 Bunion of unspecified foot: Secondary | ICD-10-CM

## 2019-07-25 DIAGNOSIS — Z9889 Other specified postprocedural states: Secondary | ICD-10-CM

## 2019-07-25 NOTE — Progress Notes (Signed)
Subjective: Brandi Carr is a 53 y.o. female patient seen today in office for POV #6 (DOS 05/19/2019), S/P left Kalish-akin bunionectomy with internal fixation. Patient reports that she has some swelling and pain along the incision that is a burning sensation with spasming states that sometimes the pain is 2 out of 10 better than before.  Patient denies any other symptoms like headache chest pain shortness of breath nausea vomiting fever or chills.  No other issues noted.  There are no active problems to display for this patient.   Current Outpatient Medications on File Prior to Visit  Medication Sig Dispense Refill  . albuterol (VENTOLIN HFA) 108 (90 Base) MCG/ACT inhaler USE 2 PUFFS 4 TIMES A DAY AS NEEDED    . ALPRAZolam (XANAX) 0.25 MG tablet Take 0.25 mg by mouth 2 (two) times daily as needed.    Marland Kitchen. azithromycin (ZITHROMAX) 250 MG tablet TAKE 2 TABLETS BY MOUTH TODAY, THEN TAKE 1 TABLET DAILY FOR 4 DAYS    . buPROPion (WELLBUTRIN XL) 150 MG 24 hr tablet     . CHANTIX CONTINUING MONTH PAK 1 MG tablet     . desonide (DESOWEN) 0.05 % ointment APPLY A THIN LAYER TO AFFECTED AREAS DAILY    . docusate sodium (COLACE) 100 MG capsule Take 1 capsule (100 mg total) by mouth 2 (two) times daily. 10 capsule 0  . fluconazole (DIFLUCAN) 150 MG tablet TAKE 1 TABLET BY MOUTH FOR ONE TIME DOSE    . ibuprofen (ADVIL) 800 MG tablet Take 1 tablet (800 mg total) by mouth every 8 (eight) hours as needed. For mild pain or for pain unrelieved by pain medication/break through pain 30 tablet 0  . nystatin ointment (MYCOSTATIN) APPLY TO THE AFFFECTED AREAS ON THE SKIN DAILY    . promethazine (PHENERGAN) 25 MG tablet Take 1 tablet (25 mg total) by mouth every 8 (eight) hours as needed for nausea or vomiting. 20 tablet 0  . rosuvastatin (CRESTOR) 10 MG tablet TAKE 1 TABLET BY MOUTH DAILY ON MONDAY, [place], AND FRIDAY    . sertraline (ZOLOFT) 100 MG tablet      No current facility-administered medications on file  prior to visit.     Allergies  Allergen Reactions  . Amoxicillin Diarrhea    Objective: There were no vitals filed for this visit.  General: No acute distress, AAOx3  Left foot: Incision well-healed, mild swelling to left forefoot, no erythema, no warmth, no drainage, no other signs of infection noted, Capillary fill time <3 seconds in all digits, gross sensation present via light touch to left foot.  No pain or crepitation with range of motion left foot normal guarding and mild burning pain consistent with postop status that is improving.  No pain with calf compression.   X-ray left foot hardware intact consistent with postoperative status osteotomy sites appear to be healing well with no acute concerns.  Assessment and Plan:  Problem List Items Addressed This Visit    None    Visit Diagnoses    S/P foot surgery, left    -  Primary   Bunion       Left foot pain          -Patient seen and evaluated -Xrays reviewed  -Advised patient to continue with ROM exercises and scar creams PRN -Continue with Motrin as needed for pain relief as needed -Return to work estimated 08/18/19 -Will plan for final post op check at next visit. In the meantime patient to call  if there is any acute problems or issues that arise.   Landis Martins, DPM

## 2019-07-28 ENCOUNTER — Other Ambulatory Visit: Payer: Self-pay | Admitting: Sports Medicine

## 2019-07-28 DIAGNOSIS — Z9889 Other specified postprocedural states: Secondary | ICD-10-CM

## 2019-07-28 DIAGNOSIS — M21619 Bunion of unspecified foot: Secondary | ICD-10-CM

## 2019-08-15 ENCOUNTER — Other Ambulatory Visit: Payer: Self-pay

## 2019-08-15 ENCOUNTER — Ambulatory Visit (INDEPENDENT_AMBULATORY_CARE_PROVIDER_SITE_OTHER): Payer: BC Managed Care – PPO | Admitting: Sports Medicine

## 2019-08-15 ENCOUNTER — Encounter: Payer: Self-pay | Admitting: Sports Medicine

## 2019-08-15 DIAGNOSIS — Z9889 Other specified postprocedural states: Secondary | ICD-10-CM | POA: Diagnosis not present

## 2019-08-15 DIAGNOSIS — M79672 Pain in left foot: Secondary | ICD-10-CM

## 2019-08-15 DIAGNOSIS — M21619 Bunion of unspecified foot: Secondary | ICD-10-CM

## 2019-08-15 MED ORDER — IBUPROFEN 800 MG PO TABS: 800.0000 mg | ORAL_TABLET | Freq: Three times a day (TID) | ORAL | 0 refills | Status: DC | PRN

## 2019-08-15 NOTE — Progress Notes (Signed)
Subjective: Brandi Carr is a 53 y.o. female patient seen today in office for POV #8 (DOS 05/19/2019), S/P left Kalish-akin bunionectomy with internal fixation. Patient reports that she has some swelling but otherwise is doing good, has only tried to wear flip flops and is using scar cream.  Patient denies any other symptoms like headache chest pain shortness of breath nausea vomiting fever or chills.  No other issues noted.  There are no active problems to display for this patient.   Current Outpatient Medications on File Prior to Visit  Medication Sig Dispense Refill  . albuterol (VENTOLIN HFA) 108 (90 Base) MCG/ACT inhaler USE 2 PUFFS 4 TIMES A DAY AS NEEDED    . ALPRAZolam (XANAX) 0.25 MG tablet Take 0.25 mg by mouth 2 (two) times daily as needed.    . azithromycin (ZITHROMAX) 250 MG tablet TAKE 2 TABLETS BY MOUTH TODAY, THEN TAKE 1 TABLET DAILY FOR 4 DAYS    . buPROPion (WELLBUTRIN XL) 150 MG 24 hr tablet     . CHANTIX CONTINUING MONTH PAK 1 MG tablet     . desonide (DESOWEN) 0.05 % ointment APPLY A THIN LAYER TO AFFECTED AREAS DAILY    . docusate sodium (COLACE) 100 MG capsule Take 1 capsule (100 mg total) by mouth 2 (two) times daily. 10 capsule 0  . fluconazole (DIFLUCAN) 150 MG tablet TAKE 1 TABLET BY MOUTH FOR ONE TIME DOSE    . ibuprofen (ADVIL) 800 MG tablet Take 1 tablet (800 mg total) by mouth every 8 (eight) hours as needed. For mild pain or for pain unrelieved by pain medication/break through pain 30 tablet 0  . nystatin ointment (MYCOSTATIN) APPLY TO THE AFFFECTED AREAS ON THE SKIN DAILY    . promethazine (PHENERGAN) 25 MG tablet Take 1 tablet (25 mg total) by mouth every 8 (eight) hours as needed for nausea or vomiting. 20 tablet 0  . rosuvastatin (CRESTOR) 10 MG tablet TAKE 1 TABLET BY MOUTH DAILY ON MONDAY, [place], AND FRIDAY    . sertraline (ZOLOFT) 100 MG tablet      No current facility-administered medications on file prior to visit.     Allergies  Allergen  Reactions  . Amoxicillin Diarrhea    Objective: There were no vitals filed for this visit.  General: No acute distress, AAOx3  Left foot: Incision well-healed, mild swelling to left forefoot, no erythema, no warmth, no drainage, no other signs of infection noted, Capillary fill time <3 seconds in all digits, gross sensation present via light touch to left foot.  No pain or crepitation with range of motion left foot normal guarding and mild burning pain consistent with postop status that is improving.  No pain with calf compression.   Assessment and Plan:  Problem List Items Addressed This Visit    None    Visit Diagnoses    S/P foot surgery, left    -  Primary   Bunion       Left foot pain          -Patient seen and evaluated -Advised patient to continue with ROM exercises and scar creams PRN -Continue with Motrin or Advil as needed for pain relief as needed -Return to work estimated 08/18/19 -D/c from post op care, global period met. In the meantime patient to call if there is any acute problems or issues that arise.   Titorya Stover, DPM  

## 2019-09-29 DIAGNOSIS — E7800 Pure hypercholesterolemia, unspecified: Secondary | ICD-10-CM | POA: Diagnosis not present

## 2019-09-29 DIAGNOSIS — Z23 Encounter for immunization: Secondary | ICD-10-CM | POA: Diagnosis not present

## 2019-09-29 DIAGNOSIS — Z0000 Encounter for general adult medical examination without abnormal findings: Secondary | ICD-10-CM | POA: Diagnosis not present

## 2020-03-09 DIAGNOSIS — Z1231 Encounter for screening mammogram for malignant neoplasm of breast: Secondary | ICD-10-CM | POA: Diagnosis not present

## 2020-03-09 DIAGNOSIS — L900 Lichen sclerosus et atrophicus: Secondary | ICD-10-CM | POA: Diagnosis not present

## 2020-03-09 DIAGNOSIS — Z6834 Body mass index (BMI) 34.0-34.9, adult: Secondary | ICD-10-CM | POA: Diagnosis not present

## 2020-03-09 DIAGNOSIS — Z13820 Encounter for screening for osteoporosis: Secondary | ICD-10-CM | POA: Diagnosis not present

## 2020-03-09 DIAGNOSIS — M810 Age-related osteoporosis without current pathological fracture: Secondary | ICD-10-CM | POA: Diagnosis not present

## 2020-03-09 DIAGNOSIS — Z01419 Encounter for gynecological examination (general) (routine) without abnormal findings: Secondary | ICD-10-CM | POA: Diagnosis not present

## 2020-03-10 ENCOUNTER — Other Ambulatory Visit: Payer: Self-pay | Admitting: Obstetrics and Gynecology

## 2020-03-10 DIAGNOSIS — F17200 Nicotine dependence, unspecified, uncomplicated: Secondary | ICD-10-CM

## 2020-03-10 DIAGNOSIS — Z139 Encounter for screening, unspecified: Secondary | ICD-10-CM

## 2020-03-16 DIAGNOSIS — N904 Leukoplakia of vulva: Secondary | ICD-10-CM | POA: Diagnosis not present

## 2020-03-16 DIAGNOSIS — N900 Mild vulvar dysplasia: Secondary | ICD-10-CM | POA: Diagnosis not present

## 2020-03-16 DIAGNOSIS — L900 Lichen sclerosus et atrophicus: Secondary | ICD-10-CM | POA: Diagnosis not present

## 2020-03-26 ENCOUNTER — Ambulatory Visit: Payer: Self-pay

## 2020-03-26 ENCOUNTER — Other Ambulatory Visit: Payer: Self-pay

## 2020-03-26 ENCOUNTER — Ambulatory Visit
Admission: RE | Admit: 2020-03-26 | Discharge: 2020-03-26 | Disposition: A | Discharge status: Home / Self Care | Payer: BC Managed Care – PPO | Source: Ambulatory Visit | Attending: Obstetrics and Gynecology | Admitting: Obstetrics and Gynecology

## 2020-03-26 DIAGNOSIS — F17210 Nicotine dependence, cigarettes, uncomplicated: Secondary | ICD-10-CM | POA: Diagnosis not present

## 2020-03-26 DIAGNOSIS — Z139 Encounter for screening, unspecified: Secondary | ICD-10-CM

## 2020-03-26 DIAGNOSIS — F17200 Nicotine dependence, unspecified, uncomplicated: Secondary | ICD-10-CM

## 2020-06-16 DIAGNOSIS — L237 Allergic contact dermatitis due to plants, except food: Secondary | ICD-10-CM | POA: Diagnosis not present

## 2020-09-03 ENCOUNTER — Encounter: Payer: Self-pay | Admitting: Sports Medicine

## 2020-09-03 ENCOUNTER — Other Ambulatory Visit: Payer: Self-pay

## 2020-09-03 ENCOUNTER — Ambulatory Visit (INDEPENDENT_AMBULATORY_CARE_PROVIDER_SITE_OTHER): Payer: BC Managed Care – PPO | Admitting: Sports Medicine

## 2020-09-03 DIAGNOSIS — M779 Enthesopathy, unspecified: Secondary | ICD-10-CM

## 2020-09-03 DIAGNOSIS — S93511A Sprain of interphalangeal joint of right great toe, initial encounter: Secondary | ICD-10-CM | POA: Diagnosis not present

## 2020-09-03 DIAGNOSIS — M21619 Bunion of unspecified foot: Secondary | ICD-10-CM

## 2020-09-03 MED ORDER — TRIAMCINOLONE ACETONIDE 10 MG/ML IJ SUSP
10.0000 mg | Freq: Once | INTRAMUSCULAR | Status: AC
  Administered 2020-09-03: 10 mg

## 2020-09-03 NOTE — Progress Notes (Signed)
Subjective: Brandi Carr is a 54 y.o. female patient who presents to office for evaluation of right foot pain.  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  There are no problems to display for this patient.   Current Outpatient Medications on File Prior to Visit  Medication Sig Dispense Refill  . albuterol (VENTOLIN HFA) 108 (90 Base) MCG/ACT inhaler USE 2 PUFFS 4 TIMES A DAY AS NEEDED    . ALPRAZolam (XANAX) 0.25 MG tablet Take 0.25 mg by mouth 2 (two) times daily as needed.    Marland Kitchen azithromycin (ZITHROMAX) 250 MG tablet TAKE 2 TABLETS BY MOUTH TODAY, THEN TAKE 1 TABLET DAILY FOR 4 DAYS    . buPROPion (WELLBUTRIN XL) 150 MG 24 hr tablet     . CHANTIX CONTINUING MONTH PAK 1 MG tablet     . desonide (DESOWEN) 0.05 % ointment APPLY A THIN LAYER TO AFFECTED AREAS DAILY    . docusate sodium (COLACE) 100 MG capsule Take 1 capsule (100 mg total) by mouth 2 (two) times daily. 10 capsule 0  . fluconazole (DIFLUCAN) 150 MG tablet TAKE 1 TABLET BY MOUTH FOR ONE TIME DOSE    . ibuprofen (ADVIL) 800 MG tablet Take 1 tablet (800 mg total) by mouth every 8 (eight) hours as needed. 30 tablet 0  . nystatin ointment (MYCOSTATIN) APPLY TO THE AFFFECTED AREAS ON THE SKIN DAILY    . promethazine (PHENERGAN) 25 MG tablet Take 1 tablet (25 mg total) by mouth every 8 (eight) hours as needed for nausea or vomiting. 20 tablet 0  . rosuvastatin (CRESTOR) 10 MG tablet TAKE 1 TABLET BY MOUTH DAILY ON MONDAY, WEDNESAY, AND FRIDAY    . sertraline  (ZOLOFT) 100 MG tablet      No current facility-administered medications on file prior to visit.    Allergies  Allergen Reactions  . Amoxicillin Diarrhea    Objective:  General: Alert and oriented x3 in no acute distress  Dermatology: No open lesions bilateral lower extremities, no webspace macerations, no ecchymosis bilateral, all nails x 10 are well manicured.  Old surgical scar at left first MPJ with history of bunion surgery.  Vascular: Dorsalis Pedis and Posterior Tibial pedal pulses palpable, Capillary Fill Time 3 seconds,(+) pedal hair growth bilateral, no edema bilateral lower extremities, Temperature gradient within normal limits.  Neurology: Michaell Cowing sensation intact via light touch bilateral.  Musculoskeletal: Mild tenderness with palpation at plantar medial hallux IPJ on the right.  There is bunion deformity noted on the right.  There is mild pain with end range of extension of the first toe on the right strength within normal limits but with mild guarding due to pain at the right great toe.  Gait: Antalgic gait   Assessment and Plan: Problem List Items Addressed This Visit    None    Visit Diagnoses    Capsulitis    -  Primary   Tendinitis       Sprain of interphalangeal joint of right great toe, initial encounter  Bunion           -Complete examination performed -Xrays were not able to be performed at this visit due to the x-ray machine not working -Discussed treatement options for likely tendinitis versus overextension of joint capsulitis with bunion -After oral consent and aseptic prep, injected a mixture containing 1 ml of 2%  plain lidocaine, 1 ml 0.5% plain marcaine, 0.5 ml of kenalog 10 and 0.5 ml of dexamethasone phosphate into right medial first toe without complication. Post-injection care discussed with patient. -Dispensed bunion shoe for patient to use as instructed -Patient to return to office as needed or sooner if condition worsens.  Asencion Islam, DPM

## 2020-11-01 DIAGNOSIS — Z0000 Encounter for general adult medical examination without abnormal findings: Secondary | ICD-10-CM | POA: Diagnosis not present

## 2020-11-01 DIAGNOSIS — Z23 Encounter for immunization: Secondary | ICD-10-CM | POA: Diagnosis not present

## 2020-11-01 DIAGNOSIS — E7800 Pure hypercholesterolemia, unspecified: Secondary | ICD-10-CM | POA: Diagnosis not present

## 2020-11-30 DIAGNOSIS — Z03818 Encounter for observation for suspected exposure to other biological agents ruled out: Secondary | ICD-10-CM | POA: Diagnosis not present

## 2020-11-30 DIAGNOSIS — U071 COVID-19: Secondary | ICD-10-CM | POA: Diagnosis not present

## 2021-01-21 DIAGNOSIS — K529 Noninfective gastroenteritis and colitis, unspecified: Secondary | ICD-10-CM | POA: Diagnosis not present

## 2021-03-16 DIAGNOSIS — Z1231 Encounter for screening mammogram for malignant neoplasm of breast: Secondary | ICD-10-CM | POA: Diagnosis not present

## 2021-03-16 DIAGNOSIS — Z01419 Encounter for gynecological examination (general) (routine) without abnormal findings: Secondary | ICD-10-CM | POA: Diagnosis not present

## 2021-03-16 DIAGNOSIS — Z6832 Body mass index (BMI) 32.0-32.9, adult: Secondary | ICD-10-CM | POA: Diagnosis not present

## 2021-03-21 ENCOUNTER — Other Ambulatory Visit: Payer: Self-pay | Admitting: Obstetrics and Gynecology

## 2021-03-21 DIAGNOSIS — Z720 Tobacco use: Secondary | ICD-10-CM

## 2021-04-26 ENCOUNTER — Ambulatory Visit: Payer: BC Managed Care – PPO

## 2021-04-28 DIAGNOSIS — L819 Disorder of pigmentation, unspecified: Secondary | ICD-10-CM | POA: Diagnosis not present

## 2021-04-28 DIAGNOSIS — L309 Dermatitis, unspecified: Secondary | ICD-10-CM | POA: Diagnosis not present

## 2021-04-28 DIAGNOSIS — D1801 Hemangioma of skin and subcutaneous tissue: Secondary | ICD-10-CM | POA: Diagnosis not present

## 2021-04-28 DIAGNOSIS — L821 Other seborrheic keratosis: Secondary | ICD-10-CM | POA: Diagnosis not present

## 2021-05-10 ENCOUNTER — Ambulatory Visit: Payer: BC Managed Care – PPO

## 2021-06-11 IMAGING — CT CT CHEST LUNG CANCER SCREENING LOW DOSE W/O CM
1 of 2 series · 10 of 20 positions shown, 13 images · non-contrast
Comparison: None.

CLINICAL DATA: Forty pack-year smoking history. Current smoker.

EXAM:
CT CHEST WITHOUT CONTRAST LOW-DOSE FOR LUNG CANCER SCREENING
TECHNIQUE: Multidetector CT imaging of the chest was performed following the
standard protocol without IV contrast.

[ct lung segmentation data · axial · 0.71mm/px · z∈[-258,-258]mm · 10 of 247 frames shown]
[frame 1/247  mediastinal]
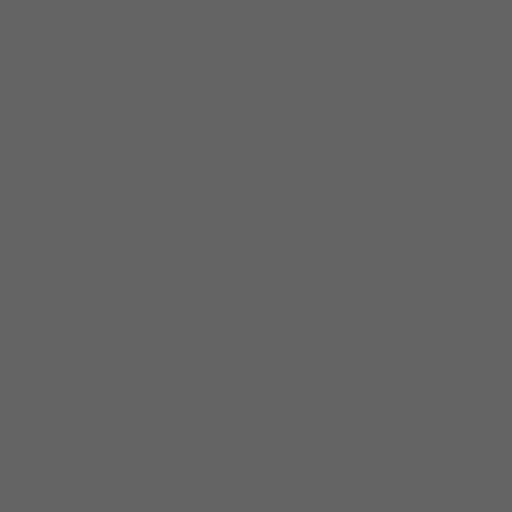
[frame 1/247  lung]
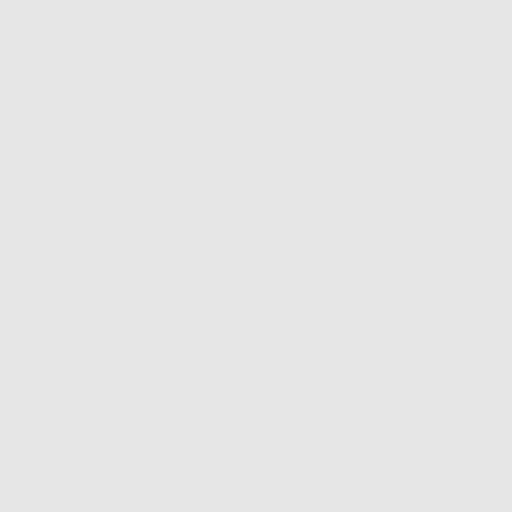
[frame 28/247  lung]
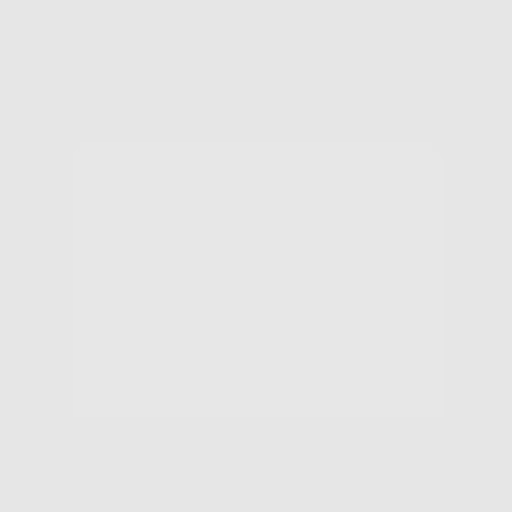
[frame 55/247  lung]
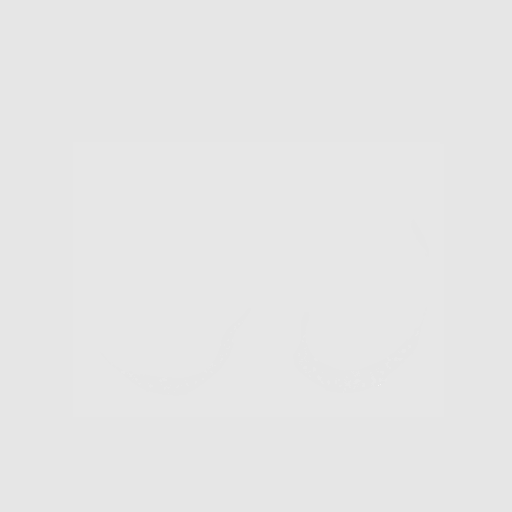
[frame 83/247  lung]
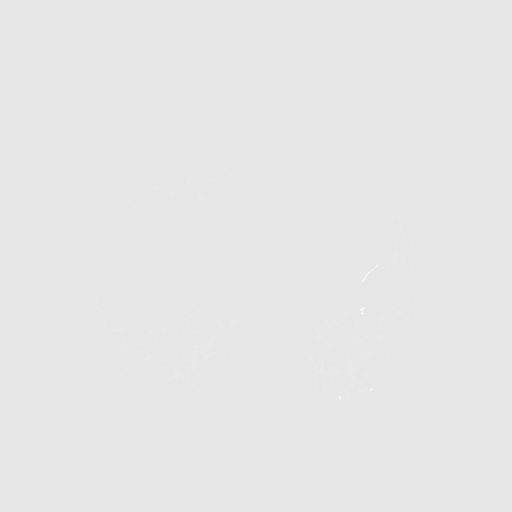
[frame 110/247  mediastinal]
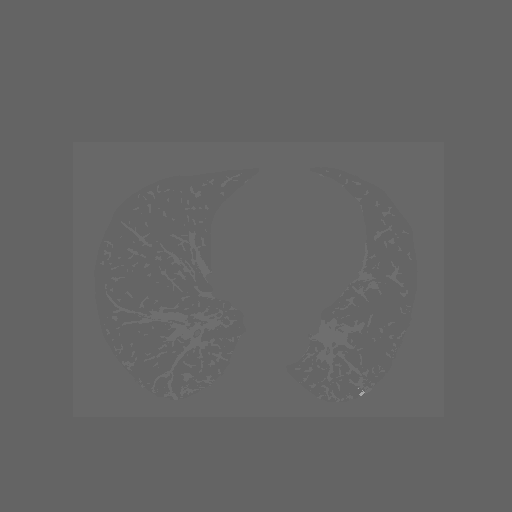
[frame 110/247  lung]
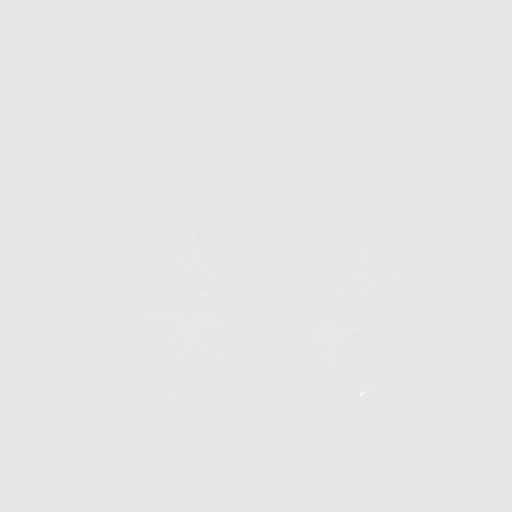
[frame 137/247  lung]
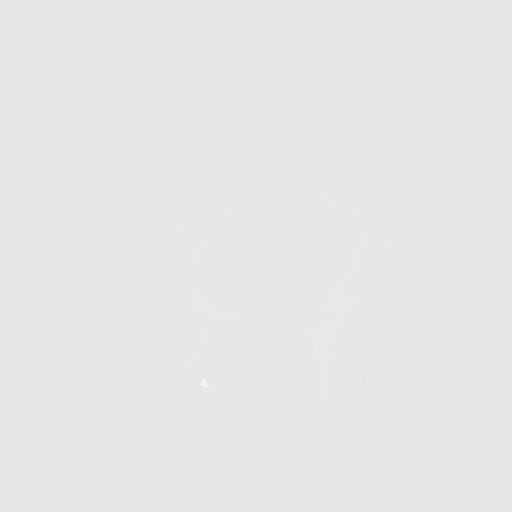
[frame 165/247  lung]
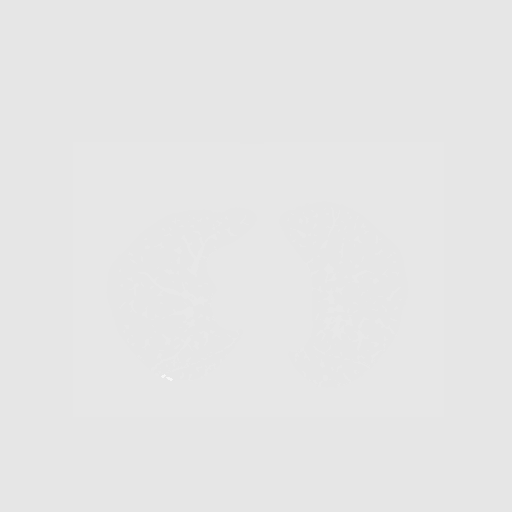
[frame 192/247  lung]
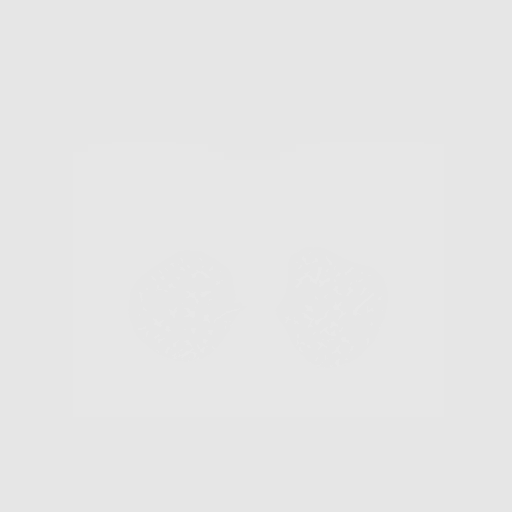
[frame 219/247  mediastinal]
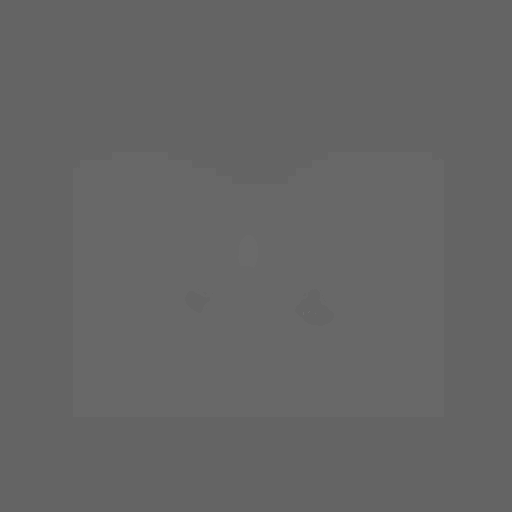
[frame 219/247  lung]
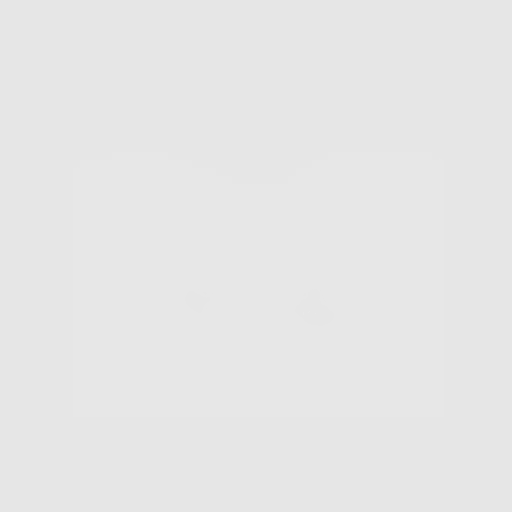
[frame 247/247  lung]
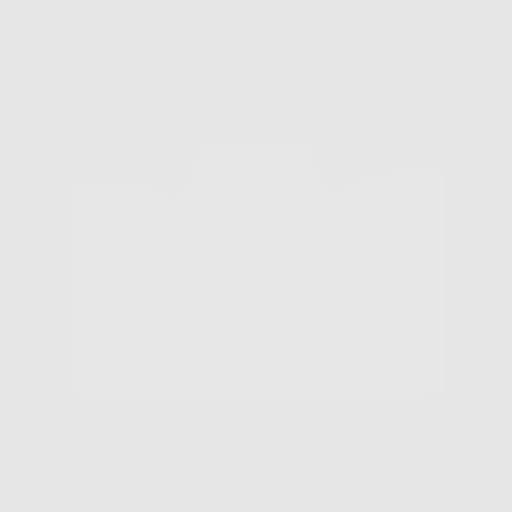

[10 of 20 positions shown; findings below may reference images not displayed]

FINDINGS: Cardiovascular: Aortic atherosclerosis. Normal heart size, without
pericardial effusion.

Mediastinum/Nodes: No mediastinal or definite hilar adenopathy,
given limitations of unenhanced CT.

Lungs/Pleura: No pleural fluid. Mild centrilobular emphysema.
Bilateral pulmonary nodules, maximally volume derived equivalent
diameter 2.9 mm.

Upper Abdomen: Normal imaged portions of the liver, spleen, stomach,
pancreas, adrenal glands, gallbladder, kidneys.

Musculoskeletal: No acute osseous abnormality. Lower cervical
spondylosis.
IMPRESSION: Lung-RADS 2, benign appearance or behavior. Continue annual
screening with low-dose chest CT without contrast in 12 months.

Aortic atherosclerosis (4Y4LP-R9H.H) and emphysema (4Y4LP-V36.C).

## 2021-07-21 ENCOUNTER — Other Ambulatory Visit: Payer: Self-pay

## 2021-07-21 ENCOUNTER — Ambulatory Visit
Admission: RE | Admit: 2021-07-21 | Discharge: 2021-07-21 | Disposition: A | Discharge status: Home / Self Care | Payer: BC Managed Care – PPO | Source: Ambulatory Visit | Attending: Obstetrics and Gynecology | Admitting: Obstetrics and Gynecology

## 2021-07-21 DIAGNOSIS — Z720 Tobacco use: Secondary | ICD-10-CM

## 2021-07-21 DIAGNOSIS — F17210 Nicotine dependence, cigarettes, uncomplicated: Secondary | ICD-10-CM | POA: Diagnosis not present

## 2022-03-17 ENCOUNTER — Ambulatory Visit
Admission: RE | Admit: 2022-03-17 | Discharge: 2022-03-17 | Disposition: A | Discharge status: Home / Self Care | Payer: 59 | Source: Ambulatory Visit | Attending: Physician Assistant | Admitting: Physician Assistant

## 2022-03-17 ENCOUNTER — Other Ambulatory Visit: Payer: Self-pay | Admitting: Physician Assistant

## 2022-03-17 DIAGNOSIS — M79671 Pain in right foot: Secondary | ICD-10-CM

## 2022-03-20 ENCOUNTER — Telehealth: Payer: Self-pay | Admitting: Sports Medicine

## 2022-03-20 NOTE — Telephone Encounter (Signed)
[facility] Radiology/Canopy called requesting exam images from 03/27/2019 [facility] foot complete 3 view Right and Left.  ? ?Please Advise.  ? ? ?

## 2022-03-29 NOTE — Telephone Encounter (Signed)
Canopy called and spoke with Randal Buba and my self and we tried to look up the x-rays from 03/27/2019 and could not locate them in the system and we relayed the message to canopy. Lattie Haw ?

## 2022-05-15 ENCOUNTER — Other Ambulatory Visit: Payer: Self-pay | Admitting: Internal Medicine

## 2022-05-15 ENCOUNTER — Ambulatory Visit
Admission: RE | Admit: 2022-05-15 | Discharge: 2022-05-15 | Disposition: A | Discharge status: Home / Self Care | Payer: 59 | Source: Ambulatory Visit | Attending: Internal Medicine | Admitting: Internal Medicine

## 2022-05-15 DIAGNOSIS — R52 Pain, unspecified: Secondary | ICD-10-CM

## 2022-05-15 DIAGNOSIS — M542 Cervicalgia: Secondary | ICD-10-CM

## 2022-05-18 ENCOUNTER — Inpatient Hospital Stay: Admission: RE | Admit: 2022-05-18 | Payer: 59 | Source: Ambulatory Visit

## 2022-08-23 ENCOUNTER — Ambulatory Visit (INDEPENDENT_AMBULATORY_CARE_PROVIDER_SITE_OTHER): Payer: 59

## 2022-08-23 ENCOUNTER — Ambulatory Visit (INDEPENDENT_AMBULATORY_CARE_PROVIDER_SITE_OTHER): Payer: 59 | Admitting: Podiatry

## 2022-08-23 DIAGNOSIS — M7752 Other enthesopathy of left foot: Secondary | ICD-10-CM

## 2022-08-23 DIAGNOSIS — M722 Plantar fascial fibromatosis: Secondary | ICD-10-CM

## 2022-08-23 DIAGNOSIS — M779 Enthesopathy, unspecified: Secondary | ICD-10-CM | POA: Diagnosis not present

## 2022-08-23 MED ORDER — TRIAMCINOLONE ACETONIDE 10 MG/ML IJ SUSP
20.0000 mg | Freq: Once | INTRAMUSCULAR | Status: AC
  Administered 2022-08-23: 20 mg

## 2022-08-24 NOTE — Progress Notes (Signed)
Subjective:   Patient ID: Brandi Carr, female   DOB: 56 y.o.   MRN: [id number]   HPI Patient states she is developed a lot of pain in the ball of her left foot over the last couple months and on the right her heel has really been bothering her with patient working at [employer] and is on her feet long hours   ROS      Objective:  Physical Exam  Neurovascular status intact with fluid buildup around the second MPJ left painful when pressed with pain also noted in the plantar heel right intense upon palpation     Assessment:  Inflammatory capsulitis left with history of bunion correction along with bunion deformity right and acute Planter fasciitis     Plan:  H&P reviewed both conditions and long-term orthotics recommended.  At this point were focusing on the acute inflammation and I did do sterile block of the left forefoot aspirated the joint got out a small amount of clear fluid injected quarter cc dexamethasone Kenalog and for the right I did a sterile prep and injected the plantar fascia at insertion 3 mg Kenalog 5 mg Xylocaine.  Discussed orthotics which I think would be good long-term and it is possible at 1 point future she may need shortening osteotomy second left  X-rays indicate that there is a slight elongation second metatarsal left screw in place left first metatarsal doing well good correction of bunion with spur formation plantar heel no indication stress fracture arthritis

## 2022-09-11 ENCOUNTER — Ambulatory Visit: Payer: 59 | Admitting: Podiatry

## 2022-09-20 ENCOUNTER — Ambulatory Visit: Payer: 59 | Admitting: Podiatry

## 2022-10-09 ENCOUNTER — Encounter: Payer: Self-pay | Admitting: Podiatry

## 2022-10-09 ENCOUNTER — Ambulatory Visit (INDEPENDENT_AMBULATORY_CARE_PROVIDER_SITE_OTHER): Payer: 59 | Admitting: Podiatry

## 2022-10-09 DIAGNOSIS — M7752 Other enthesopathy of left foot: Secondary | ICD-10-CM | POA: Diagnosis not present

## 2022-10-09 DIAGNOSIS — M7751 Other enthesopathy of right foot: Secondary | ICD-10-CM | POA: Diagnosis not present

## 2022-10-11 DIAGNOSIS — M7752 Other enthesopathy of left foot: Secondary | ICD-10-CM | POA: Diagnosis not present

## 2022-10-11 MED ORDER — TRIAMCINOLONE ACETONIDE 10 MG/ML IJ SUSP
10.0000 mg | Freq: Once | INTRAMUSCULAR | Status: AC
  Administered 2022-10-11: 10 mg

## 2022-10-11 NOTE — Progress Notes (Signed)
Subjective:   Patient ID: Brandi Carr, female   DOB: 56 y.o.   MRN: [id number]   HPI Patient presents stating she has a lot of pain in her left forefoot still and knows that she may need surgery but she desperately wants to wait till next year if possible and wants to see whether or not orthotics can help with the condition due to the advanced nature of pain   ROS      Objective:  Physical Exam  Neurovascular status intact with inflammation significant fluid buildup around the second MPJ left that is very painful with structural bunion deformity right and chronic pathology right foot.  Patient has good digital perfusion well-oriented     Assessment:  Inflammatory capsulitis of the second MPJ left foot with fluid buildup that has not responded so far along with structural deformity right foot     Plan:  H&P reviewed at great length.  I do think that good chance ultimately this will require surgery but organ to give it 1 more chance before we consider this for the future.  I did explain risk of injection she wants to have this done I went ahead today and I did do forefoot block left I then aspirated the second MPJ getting out a small amount of clear fluid injected quarter cc dexamethasone Kenalog and continue using thick pads and I casted her for functional orthotics to offload the second metatarsal left and reduce forefoot pressure bilateral.  Patient will be seen back when those are ready and again understands long-term probable shortening osteotomy left hip correction of the right foot will be necessary

## 2022-11-07 ENCOUNTER — Ambulatory Visit (INDEPENDENT_AMBULATORY_CARE_PROVIDER_SITE_OTHER): Payer: 59 | Admitting: Podiatry

## 2022-11-07 ENCOUNTER — Telehealth: Payer: Self-pay | Admitting: Podiatry

## 2022-11-07 DIAGNOSIS — M779 Enthesopathy, unspecified: Secondary | ICD-10-CM

## 2022-11-07 DIAGNOSIS — M7751 Other enthesopathy of right foot: Secondary | ICD-10-CM | POA: Diagnosis not present

## 2022-11-07 DIAGNOSIS — M7752 Other enthesopathy of left foot: Secondary | ICD-10-CM | POA: Diagnosis not present

## 2022-11-07 DIAGNOSIS — M722 Plantar fascial fibromatosis: Secondary | ICD-10-CM

## 2022-11-07 NOTE — Telephone Encounter (Signed)
Left message on vm for patient to call back to schedule time to pick up orthotics .    No Balance

## 2022-11-07 NOTE — Progress Notes (Signed)
Patient presents today to pick up custom molded foot orthotics recommended by Dr. Charlsie Merles.   Orthotics were dispensed and fit was satisfactory. Reviewed instructions for break-in and wear. Written instructions given to patient.  Patient will follow up as needed.   [facility] - order # [id number]

## 2022-12-26 ENCOUNTER — Other Ambulatory Visit: Payer: Self-pay | Admitting: Obstetrics and Gynecology

## 2022-12-26 DIAGNOSIS — Z8249 Family history of ischemic heart disease and other diseases of the circulatory system: Secondary | ICD-10-CM

## 2023-01-03 ENCOUNTER — Telehealth: Payer: Self-pay

## 2023-01-03 ENCOUNTER — Telehealth: Payer: Self-pay | Admitting: Podiatry

## 2023-01-03 NOTE — Telephone Encounter (Signed)
Pt called asking if she could get a shot again. Her last one was in November of last yr. Please advise and I will call pt and get her scheduled if ok.

## 2023-01-03 NOTE — Telephone Encounter (Signed)
Spoke with nurse and she said it was ok to get a injection if Dr Paulla Dolly agrees and I have scheduled pt to be seen 2.8.2024 in [place].

## 2023-01-04 ENCOUNTER — Ambulatory Visit (INDEPENDENT_AMBULATORY_CARE_PROVIDER_SITE_OTHER): Payer: 59 | Admitting: Podiatry

## 2023-01-04 ENCOUNTER — Encounter: Payer: Self-pay | Admitting: Podiatry

## 2023-01-04 DIAGNOSIS — M21619 Bunion of unspecified foot: Secondary | ICD-10-CM

## 2023-01-04 DIAGNOSIS — M779 Enthesopathy, unspecified: Secondary | ICD-10-CM | POA: Diagnosis not present

## 2023-01-04 DIAGNOSIS — M21621 Bunionette of right foot: Secondary | ICD-10-CM | POA: Diagnosis not present

## 2023-01-04 MED ORDER — TRIAMCINOLONE ACETONIDE 10 MG/ML IJ SUSP
10.0000 mg | Freq: Once | INTRAMUSCULAR | Status: AC
  Administered 2023-01-04: 10 mg

## 2023-01-05 NOTE — Progress Notes (Signed)
Subjective:   Patient ID: Brandi Carr, female   DOB: 57 y.o.   MRN: [id number]   HPI Patient presents stating I normally do need surgery but I am desperate to make it a couple more months as I am going out of town in April and I would like to wait till May to do surgery.  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 to work with patient to know she is getting need surgery with structural deformity right     Plan:  H&P reviewed all conditions were to do 1 last injection left sterile prep 60 mg like Marcaine mixture aspirated joint getting out a small amount of fluid injected quarter cc dexamethasone Kenalog for the right discussed distal osteotomy first and fifth metatarsal which I think will take care of this and that we can do both feet at the same time and she wants to do in May will return in April  X-rays indicate significant structural bunion deformity right elevation of intermetatarsal angle 15 degrees tailor's bunion deformity right elongated second metatarsal left history of bunion correction left with patient having tried wider shoes soaks and other modalities

## 2023-01-10 NOTE — Telephone Encounter (Signed)
Left message for pt to call to get scheduled with Dr Paulla Dolly possibly this week.

## 2023-01-10 NOTE — Telephone Encounter (Signed)
Pt was already scheduled and seen 2.8.2024 with Dr Paulla Dolly.

## 2023-01-10 NOTE — Telephone Encounter (Signed)
Should be able to

## 2023-01-17 ENCOUNTER — Other Ambulatory Visit: Payer: 59

## 2023-01-25 ENCOUNTER — Ambulatory Visit
Admission: RE | Admit: 2023-01-25 | Discharge: 2023-01-25 | Disposition: A | Discharge status: Home / Self Care | Payer: 59 | Source: Ambulatory Visit | Attending: Obstetrics and Gynecology | Admitting: Obstetrics and Gynecology

## 2023-01-25 ENCOUNTER — Ambulatory Visit
Admission: RE | Admit: 2023-01-25 | Discharge: 2023-01-25 | Disposition: A | Discharge status: Home / Self Care | Payer: No Typology Code available for payment source | Source: Ambulatory Visit | Attending: Obstetrics and Gynecology | Admitting: Obstetrics and Gynecology

## 2023-01-25 DIAGNOSIS — Z8249 Family history of ischemic heart disease and other diseases of the circulatory system: Secondary | ICD-10-CM

## 2023-02-05 ENCOUNTER — Ambulatory Visit: Payer: 59 | Admitting: Podiatry

## 2023-03-06 ENCOUNTER — Encounter: Payer: Self-pay | Admitting: Interventional Cardiology

## 2023-03-06 ENCOUNTER — Ambulatory Visit: Payer: 59 | Attending: Interventional Cardiology | Admitting: Interventional Cardiology

## 2023-03-06 ENCOUNTER — Encounter: Payer: Self-pay | Admitting: *Deleted

## 2023-03-06 VITALS — BP 128/86 | HR 75 | Ht 63.0 in | Wt 183.8 lb

## 2023-03-06 DIAGNOSIS — E669 Obesity, unspecified: Secondary | ICD-10-CM | POA: Diagnosis not present

## 2023-03-06 DIAGNOSIS — E66811 Obesity, class 1: Secondary | ICD-10-CM

## 2023-03-06 DIAGNOSIS — I2510 Atherosclerotic heart disease of native coronary artery without angina pectoris: Secondary | ICD-10-CM | POA: Diagnosis not present

## 2023-03-06 DIAGNOSIS — Z87891 Personal history of nicotine dependence: Secondary | ICD-10-CM | POA: Diagnosis not present

## 2023-03-06 DIAGNOSIS — I2584 Coronary atherosclerosis due to calcified coronary lesion: Secondary | ICD-10-CM

## 2023-03-06 DIAGNOSIS — Z6832 Body mass index (BMI) 32.0-32.9, adult: Secondary | ICD-10-CM

## 2023-03-06 MED ORDER — ASPIRIN 81 MG PO TBEC: 81.0000 mg | DELAYED_RELEASE_TABLET | Freq: Every day | ORAL | 3 refills | Status: DC

## 2023-03-06 NOTE — Progress Notes (Signed)
Cardiology Office Note   Date:  03/06/2023   ID:  Brandi Carr, Brandi Carr [birth year], MRN [id number]  PCP:  Brandi Carr.Brandi Saucer, MD    No chief complaint on file.  Coronary atherosclerosis  Wt Readings from Last 3 Encounters:  03/06/23 183 lb 12.8 oz (83.4 kg)  03/26/19 194 lb (88 kg)       History of Present Illness: Brandi Carr is a 57 y.o. female who is being seen today for the evaluation of coronary calcification at the request of McCombs, Cornell Barman, MD.   Coronary calcium score: "CORONARY CALCIUM SCORES:   Left Main: 0   LAD: 18.7   LCx: 0   RCA: 0   Total Agatston Score: 18.7   MESA database percentile: 80   AORTA MEASUREMENTS:   Ascending Aorta: 31 cm   Descending Aorta:22 cm   OTHER FINDINGS:   Heart is normal size. Aorta normal caliber. Scattered aortic calcifications in the ascending thoracic aorta. No adenopathy. No confluent airspace opacities or effusions. No acute findings in the upper abdomen. Chest wall soft tissues are unremarkable. No acute bony abnormality.   IMPRESSION: Total Agatston score: 18.7   [place] database percentile: 80   Scattered aortic calcifications.   No acute extra cardiac abnormality"  She used to work at [employer] but they moved to [place] in 2021.  She now works with [employer].   Mother with MI at a young age, 62s.  4 siblings (brothers).  Denies : Chest pain. Dizziness. Leg edema. Nitroglycerin use. Orthopnea. Palpitations. Paroxysmal nocturnal dyspnea. Shortness of breath. Syncope.    Walks a lot at work.  No symptoms with that activity.  Quit smoking a few years ago.    Past Medical History:  Diagnosis Date   Adenomyosis of uterus    Dysplasia of cervix    High cholesterol    Lichen sclerosus    Menopausal symptoms    Menopausal vasomotor syndrome    Osteopenia    Perirectal discomfort    Tobacco user     Past Surgical History:  Procedure Laterality Date   ABDOMINAL HYSTERECTOMY     EXCISION OF  BUNION     TUBAL LIGATION       Current Outpatient Medications  Medication Sig Dispense Refill   ALPRAZolam (XANAX) 0.25 MG tablet Take 0.25 mg by mouth 2 (two) times daily as needed.     buPROPion (WELLBUTRIN XL) 150 MG 24 hr tablet Take 150 mg by mouth in the morning and at bedtime.     clobetasol cream (TEMOVATE) 0.05 % Apply 1 Application topically 2 (two) times daily.     clobetasol ointment (TEMOVATE) 0.05 % Apply 1 Application topically 2 (two) times daily.     ibuprofen (ADVIL) 800 MG tablet Take 800 mg by mouth every 8 (eight) hours as needed.     rosuvastatin (CRESTOR) 10 MG tablet TAKE 1 TABLET BY MOUTH DAILY ON MONDAY, WEDNESAY, AND FRIDAY     sertraline (ZOLOFT) 100 MG tablet      valACYclovir (VALTREX) 1000 MG tablet Take 1,000 mg by mouth as needed.     No current facility-administered medications for this visit.    Allergies:   Amoxicillin    Social History:  The patient  reports that she has been smoking cigarettes. She has been smoking an average of .5 packs per day. She has never used smokeless tobacco.   Family History:  The patient's family history includes AAA (abdominal aortic aneurysm) in her brother,  maternal grandmother, and mother; Asthma in her maternal grandmother and mother; Heart disease in her maternal uncle and mother; Kidney disease in her brother.    ROS:  Please see the history of present illness.   Otherwise, review of systems are positive for .   All other systems are reviewed and negative.    PHYSICAL EXAM: VS:  BP 128/86   Pulse 75   Ht 5\' 3"  (1.6 m)   Wt 183 lb 12.8 oz (83.4 kg)   SpO2 95%   BMI 32.56 kg/m  , BMI Body mass index is 32.56 kg/m. GEN: Well nourished, well developed, in no acute distress HEENT: normal Neck: no JVD, carotid bruits, or masses Cardiac: RRR; no murmurs, rubs, or gallops,no edema  Respiratory:  clear to auscultation bilaterally, normal work of breathing GI: soft, nontender, nondistended, + BS MS: no  deformity or atrophy Skin: warm and dry, no rash Neuro:  Strength and sensation are intact Psych: euthymic mood, full affect   EKG:   The ekg ordered today demonstrates NSR, no ST changes   Recent Labs: No results found for requested labs within last 365 days.   Lipid Panel No results found for: "CHOL", "TRIG", "HDL", "CHOLHDL", "VLDL", "LDLCALC", "LDLDIRECT"   Other studies Reviewed: Additional studies/ records that were reviewed today with results demonstrating: labs reviewed- LDL 86.   ASSESSMENT AND PLAN:  Coronary artery calcification:  Start ASA 81 mg daily.  Watch for bleeding.  Healthy diet. whole food plant-based diet. high fiber diet.  avoid processed foods.  LDL 86 on rosuvastatin 3 times a week.  Would like to see LDL closer to 70.  Hopefully, she can do this with lifestyle changes.  If not, could increase frequency of rosuvastatin. Former smoker: She quit a few years ago.  No cravings to resume smoking. Obesity: We spoke about the exercise target noted below and adding some strength training as well.   Current medicines are reviewed at length with the patient today.  The patient concerns regarding her medicines were addressed.  The following changes have been made: Start aspirin 81 mg daily  Labs/ tests ordered today include: Labs to be checked by primary care doctor No orders of the defined types were placed in this encounter.   Recommend 150 minutes/week of aerobic exercise Low fat, low carb, high fiber diet recommended  Disposition:   FU in 1 year   Signed, Brandi Muss, MD  03/06/2023 9:00 AM    [facility] [address] [place], [place], [place]  [address] Phone: [phone]; Fax: [phone]

## 2023-03-06 NOTE — Patient Instructions (Signed)
Medication Instructions:  Your physician has recommended you make the following change in your medication: Start aspirin 81 mg by mouth daily   *If you need a refill on your cardiac medications before your next appointment, please call your pharmacy*   Lab Work: none If you have labs (blood work) drawn today and your tests are completely normal, you will receive your results only by: MyChart Message (if you have MyChart) OR A paper copy in the mail If you have any lab test that is abnormal or we need to change your treatment, we will call you to review the results.   Testing/Procedures: none   Follow-Up: At [facility], you and your health needs are our priority.  As part of our continuing mission to provide you with exceptional heart care, we have created designated Provider Care Teams.  These Care Teams include your primary Cardiologist (physician) and Advanced Practice Providers (APPs -  Physician Assistants and Nurse Practitioners) who all work together to provide you with the care you need, when you need it.  We recommend signing up for the patient portal called "MyChart".  Sign up information is provided on this After Visit Summary.  MyChart is used to connect with patients for Virtual Visits (Telemedicine).  Patients are able to view lab/test results, encounter notes, upcoming appointments, etc.  Non-urgent messages can be sent to your provider as well.   To learn more about what you can do with MyChart, go to [email or web address].    Your next appointment:   12 month(s)  Provider:   Lance Muss, MD     Other Instructions  High-Fiber Eating Plan Fiber, also called dietary fiber, is a type of carbohydrate. It is found foods such as fruits, vegetables, whole grains, and beans. A high-fiber diet can have many health benefits. Your health care provider may recommend a high-fiber diet to help: Prevent constipation. Fiber can make your bowel movements more  regular. Lower your cholesterol. Relieve the following conditions: Inflammation of veins in the anus (hemorrhoids). Inflammation of specific areas of the digestive tract (uncomplicated diverticulosis). A problem of the large intestine, also called the colon, that sometimes causes pain and diarrhea (irritable bowel syndrome, or IBS). Prevent overeating as part of a weight-loss plan. Prevent heart disease, type 2 diabetes, and certain cancers. What are tips for following this plan? Reading food labels  Check the nutrition facts label on food products for the amount of dietary fiber. Choose foods that have 5 grams of fiber or more per serving. The goals for recommended daily fiber intake include: Men (age 36 or younger): 34-38 g. Men (over age 4): 28-34 g. Women (age 14 or younger): 25-28 g. Women (over age 75): 22-25 g. Your daily fiber goal is _____________ g. Shopping Choose whole fruits and vegetables instead of processed forms, such as apple juice or applesauce. Choose a wide variety of high-fiber foods such as avocados, lentils, oats, and kidney beans. Read the nutrition facts label of the foods you choose. Be aware of foods with added fiber. These foods often have high sugar and sodium amounts per serving. Cooking Use whole-grain flour for baking and cooking. Cook with brown rice instead of white rice. Meal planning Start the day with a breakfast that is high in fiber, such as a cereal that contains 5 g of fiber or more per serving. Eat breads and cereals that are made with whole-grain flour instead of refined flour or white flour. Eat brown rice, bulgur wheat, or millet instead  of white rice. Use beans in place of meat in soups, salads, and pasta dishes. Be sure that half of the grains you eat each day are whole grains. General information You can get the recommended daily intake of dietary fiber by: Eating a variety of fruits, vegetables, grains, nuts, and beans. Taking a  fiber supplement if you are not able to take in enough fiber in your diet. It is better to get fiber through food than from a supplement. Gradually increase how much fiber you consume. If you increase your intake of dietary fiber too quickly, you may have bloating, cramping, or gas. Drink plenty of water to help you digest fiber. Choose high-fiber snacks, such as berries, raw vegetables, nuts, and popcorn. What foods should I eat? Fruits Berries. Pears. Apples. Oranges. Avocado. Prunes and raisins. Dried figs. Vegetables Sweet potatoes. Spinach. Kale. Artichokes. Cabbage. Broccoli. Cauliflower. Green peas. Carrots. Squash. Grains Whole-grain breads. Multigrain cereal. Oats and oatmeal. Brown rice. Barley. Bulgur wheat. Millet. Quinoa. Bran muffins. Popcorn. Rye wafer crackers. Meats and other proteins Navy beans, kidney beans, and pinto beans. Soybeans. Split peas. Lentils. Nuts and seeds. Dairy Fiber-fortified yogurt. Beverages Fiber-fortified soy milk. Fiber-fortified orange juice. Other foods Fiber bars. The items listed above may not be a complete list of recommended foods and beverages. Contact a dietitian for more information. What foods should I avoid? Fruits Fruit juice. Cooked, strained fruit. Vegetables Fried potatoes. Canned vegetables. Well-cooked vegetables. Grains White bread. Pasta made with refined flour. White rice. Meats and other proteins Fatty cuts of meat. Fried chicken or fried fish. Dairy Milk. Yogurt. Cream cheese. Sour cream. Fats and oils Butters. Beverages Soft drinks. Other foods Cakes and pastries. The items listed above may not be a complete list of foods and beverages to avoid. Talk with your dietitian about what choices are best for you. Summary Fiber is a type of carbohydrate. It is found in foods such as fruits, vegetables, whole grains, and beans. A high-fiber diet has many benefits. It can help to prevent constipation, lower blood  cholesterol, aid weight loss, and reduce your risk of heart disease, diabetes, and certain cancers. Increase your intake of fiber gradually. Increasing fiber too quickly may cause cramping, bloating, and gas. Drink plenty of water while you increase the amount of fiber you consume. The best sources of fiber include whole fruits and vegetables, whole grains, nuts, seeds, and beans. This information is not intended to replace advice given to you by your health care provider. Make sure you discuss any questions you have with your health care provider. Document Revised: 03/18/2020 Document Reviewed: 03/18/2020 Elsevier Patient Education  2023 [employer].

## 2023-03-06 NOTE — Addendum Note (Signed)
Addended by: Lacy Duverney R on: 03/06/2023 05:07 PM   Modules accepted: Orders

## 2023-05-24 ENCOUNTER — Other Ambulatory Visit (HOSPITAL_COMMUNITY): Payer: Self-pay

## 2023-05-24 MED ORDER — ALBUTEROL SULFATE HFA 108 (90 BASE) MCG/ACT IN AERS
2.0000 | INHALATION_SPRAY | RESPIRATORY_TRACT | 0 refills | Status: AC | PRN
  Filled 2023-05-24: qty 6.7, 25d supply, fill #0

## 2023-05-25 ENCOUNTER — Other Ambulatory Visit (HOSPITAL_COMMUNITY): Payer: Self-pay

## 2023-06-24 ENCOUNTER — Other Ambulatory Visit: Payer: Self-pay

## 2023-06-24 ENCOUNTER — Encounter (HOSPITAL_BASED_OUTPATIENT_CLINIC_OR_DEPARTMENT_OTHER): Payer: Self-pay | Admitting: Emergency Medicine

## 2023-06-24 ENCOUNTER — Emergency Department (HOSPITAL_BASED_OUTPATIENT_CLINIC_OR_DEPARTMENT_OTHER)
Admission: EM | Admit: 2023-06-24 | Discharge: 2023-06-24 | Disposition: A | Discharge status: Home / Self Care | Payer: 59 | Attending: Emergency Medicine | Admitting: Emergency Medicine

## 2023-06-24 ENCOUNTER — Emergency Department (HOSPITAL_BASED_OUTPATIENT_CLINIC_OR_DEPARTMENT_OTHER): Payer: 59

## 2023-06-24 DIAGNOSIS — M5137 Other intervertebral disc degeneration, lumbosacral region: Secondary | ICD-10-CM | POA: Insufficient documentation

## 2023-06-24 DIAGNOSIS — M5431 Sciatica, right side: Secondary | ICD-10-CM | POA: Diagnosis not present

## 2023-06-24 DIAGNOSIS — M545 Low back pain, unspecified: Secondary | ICD-10-CM | POA: Diagnosis present

## 2023-06-24 DIAGNOSIS — Z87891 Personal history of nicotine dependence: Secondary | ICD-10-CM | POA: Diagnosis not present

## 2023-06-24 DIAGNOSIS — Z7982 Long term (current) use of aspirin: Secondary | ICD-10-CM | POA: Insufficient documentation

## 2023-06-24 MED ORDER — PREDNISONE 20 MG PO TABS: 40.0000 mg | ORAL_TABLET | Freq: Every day | ORAL | 0 refills | Status: AC

## 2023-06-24 NOTE — ED Notes (Signed)
Pt spoke with PA about d/c and was seen by other nursing staff walking out prior to getting d/c paperwork and discharge teaching from RN.

## 2023-06-24 NOTE — Discharge Instructions (Addendum)
As discussed, you have degenerative disc disease and stenosis of the foramen that's causing your pain. I have sent a 5 day prescription of Prednisone to your pharmacy. Take NSAIDs, like Ibuprofen or Advil, every 8 hours as needed for pain. I have provided instructions for stretches you can do to help with the pain.  Information for orthopedics provided. Call the office to schedule an appointment for further evaluation and management.  Get help right away if you: Have severe pain. Notice weakness in your arms, hands, or legs. Begin to lose control of your bladder or bowel movements. Have fevers or night sweats.

## 2023-06-24 NOTE — ED Triage Notes (Signed)
For at least a month, has had right lower back pain and right knee pain that is now in her thigh as well. Sharp intermittent,and has been in her right calf as well.

## 2023-06-24 NOTE — ED Provider Notes (Signed)
[place] [facility] AT [facility] Provider Note   CSN: [id number] Arrival date & time: 06/24/23  1101     History  Chief Complaint  Patient presents with   Back Pain    Brandi Carr is a 57 y.o. female with a history of hyperlipidemia, osteopenia, and tobacco use who presents to the ED today with low back pain and leg pain. Patient reports that for the past month she has had right sided lower back pain as well as pain to her right leg. The leg pain present at both the anterior and posterior aspect of the thigh, knee, and calf.  She did fly on a plane to [place] about a month ago. She was able to ambulate to the room.  No fever, IVDU, numbness or weakness to the lower extremities. No loss of bowel or bladder function.  Denies other complaints or concerns at this time.    Home Medications Prior to Admission medications   Medication Sig Start Date End Date Taking? Authorizing Provider  predniSONE (DELTASONE) 20 MG tablet Take 2 tablets (40 mg total) by mouth daily for 5 days. 06/24/23 06/29/23 Yes Maxwell Marion, PA-C  albuterol (VENTOLIN HFA) 108 (90 Base) MCG/ACT inhaler Inhale 2 puffs into the lungs every 4 (four) hours as needed. 05/24/23     ALPRAZolam (XANAX) 0.25 MG tablet Take 0.25 mg by mouth 2 (two) times daily as needed. 03/03/19   [provider]  aspirin EC 81 MG tablet Take 1 tablet (81 mg total) by mouth daily. Swallow whole. 03/06/23   Corky Crafts, MD  buPROPion (WELLBUTRIN XL) 150 MG 24 hr tablet Take 150 mg by mouth in the morning and at bedtime. 03/18/19   [provider]  clobetasol cream (TEMOVATE) 0.05 % Apply 1 Application topically 2 (two) times daily.    [provider]  clobetasol ointment (TEMOVATE) 0.05 % Apply 1 Application topically 2 (two) times daily.    [provider]  ibuprofen (ADVIL) 800 MG tablet Take 800 mg by mouth every 8 (eight) hours as needed.    [provider]  rosuvastatin  (CRESTOR) 10 MG tablet TAKE 1 TABLET BY MOUTH DAILY ON Duwaine Maxin, AND FRIDAY 02/16/19   [provider]  sertraline (ZOLOFT) 100 MG tablet  03/18/19   [provider]  valACYclovir (VALTREX) 1000 MG tablet Take 1,000 mg by mouth as needed.    [provider]      Allergies    Amoxicillin    Review of Systems   Review of Systems  Musculoskeletal:  Positive for back pain.  All other systems reviewed and are negative.   Physical Exam Updated Vital Signs BP (!) 103/51   Pulse (!) 55   Temp 98.5 F (36.9 C) (Oral)   Resp 16   SpO2 100%  Physical Exam Vitals and nursing note reviewed.  Constitutional:      General: She is not in acute distress.    Appearance: Normal appearance.  HENT:     Head: Normocephalic and atraumatic.     Mouth/Throat:     Mouth: Mucous membranes are moist.  Eyes:     Conjunctiva/sclera: Conjunctivae normal.     Pupils: Pupils are equal, round, and reactive to light.  Cardiovascular:     Rate and Rhythm: Normal rate and regular rhythm.     Pulses: Normal pulses.     Heart sounds: Normal heart sounds.  Pulmonary:     Effort: Pulmonary effort is normal.  Breath sounds: Normal breath sounds.  Abdominal:     Palpations: Abdomen is soft.     Tenderness: There is no abdominal tenderness.  Musculoskeletal:        General: Tenderness present.     Cervical back: Normal range of motion.     Right lower leg: No edema.     Left lower leg: No edema.     Comments: Tenderness to palpation of  Skin:    General: Skin is warm and dry.     Findings: No rash.  Neurological:     General: No focal deficit present.     Mental Status: She is alert.     Sensory: No sensory deficit.     Motor: No weakness.     Gait: Gait normal.  Psychiatric:        Mood and Affect: Mood normal.        Behavior: Behavior normal.     ED Results / Procedures / Treatments   Labs (all labs ordered are listed, but only abnormal results are  displayed) Labs Reviewed - No data to display  EKG None  Radiology US Venous Img Lower Unilateral Right  Result Date: 06/24/2023 CLINICAL DATA:  pain EXAM: Right LOWER EXTREMITY VENOUS DOPPLER ULTRASOUND TECHNIQUE: Gray-scale sonography with compression, as well as color and duplex ultrasound, were performed to evaluate the deep venous system(s) from the level of the common femoral vein through the popliteal and proximal calf veins. COMPARISON:  None Available. FINDINGS: VENOUS Normal compressibility of the common femoral, superficial femoral, and popliteal veins, as well as the visualized calf veins. Visualized portions of profunda femoral vein and great saphenous vein unremarkable. No filling defects to suggest DVT on grayscale or color Doppler imaging. Doppler waveforms show normal direction of venous flow, normal respiratory plasticity and response to augmentation. Limited views of the contralateral common femoral vein are unremarkable. OTHER None. Limitations: none IMPRESSION: No evidence of right lower extremity DVT. Electronically Signed   By: Karen Kays M.D.   On: 06/24/2023 15:07   CT Lumbar Spine Wo Contrast  Result Date: 06/24/2023 CLINICAL DATA:  Lumbar radiculopathy, no red flags, no prior management EXAM: CT LUMBAR SPINE WITHOUT CONTRAST TECHNIQUE: Multidetector CT imaging of the lumbar spine was performed without intravenous contrast administration. Multiplanar CT image reconstructions were also generated. RADIATION DOSE REDUCTION: This exam was performed according to the departmental dose-optimization program which includes automated exposure control, adjustment of the mA and/or kV according to patient size and/or use of iterative reconstruction technique. COMPARISON:  None Available. FINDINGS: Segmentation: 5 lumbar type vertebrae. Alignment: Trace retrolisthesis of L5 on S1. Vertebrae: No acute fracture or focal pathologic process. Chronic degenerative endplate changes at L5-S1.  Paraspinal and other soft tissues: Aortic atherosclerosis. No acute findings. Disc levels: Disc height loss and endplate spurring at L5-S1 with evidence of bilateral foraminal stenosis, worse on the left. No evidence of canal stenosis by CT. Remaining intervertebral disc levels of the lumbar spine demonstrate preserved disc heights. Mild lower lumbar facet arthropathy. IMPRESSION: 1. No acute fracture or traumatic subluxation of the lumbar spine. 2. Degenerative disc disease at L5-S1 with evidence of bilateral foraminal stenosis, worse on the left. 3. Aortic atherosclerosis (ICD10-I70.0). Electronically Signed   By: Duanne Guess D.O.   On: 06/24/2023 14:52    Procedures Procedures: not indicated.   Medications Ordered in ED Medications - No data to display  ED Course/ Medical Decision Making/ A&P  Medical Decision Making Amount and/or Complexity of Data Reviewed Radiology: ordered.  Risk Prescription drug management.   This patient presents to the ED for concern of right lower back and leg pain, this involves an extensive number of treatment options, and is a complaint that carries with it a high risk of complications and morbidity.   Differential diagnosis includes: DDD, radiculopathy, sciatic nerve pain, DVT, muscle strain,    Co morbidities that complicate the patient evaluation  Osteopenia Lichen sclerosis Hyperlipidemia   Additional history obtained:  Additional history obtained from patient's records.   Imaging Studies ordered:  I ordered imaging studies including: Right lower extremity venous ultrasound which showed no evidence of right lower extremity DVT. CT lumbar spine showed no acute fracture or traumatic subluxation of the lumbar spine.  Degenerative disc disease at 5 to S1 with evidence of bilateral foraminal stenosis, worse at the left. I agree with the radiologist interpretation   Problem List / ED Course / Critical  interventions / Medication management  Right lower back pain and right leg pain Patient declined need for pain medication at this time I have reviewed the patients home medicines and have made adjustments as needed   Social Determinants of Health:  Tobacco use   Test / Admission - Considered:  Discussed results with patient. Patient is stable and safe for discharge home. Prednisone sent to pharmacy. Information for orthopedics provided to follow up with. Return precautions provided.       Final Clinical Impression(s) / ED Diagnoses Final diagnoses:  Degenerative disc disease at L5-S1 level  Sciatic nerve pain, right    Rx / DC Orders ED Discharge Orders          Ordered    predniSONE (DELTASONE) 20 MG tablet  Daily        06/24/23 1543              Maxwell Marion, PA-C 06/24/23 1608    Arby Barrette, MD 07/10/23 1414

## 2023-06-24 NOTE — ED Triage Notes (Signed)
Pt did fly in June to [place], but states it was already hurting her.

## 2023-12-31 ENCOUNTER — Other Ambulatory Visit: Payer: Self-pay | Admitting: Obstetrics and Gynecology

## 2023-12-31 DIAGNOSIS — Z720 Tobacco use: Secondary | ICD-10-CM

## 2024-01-22 ENCOUNTER — Other Ambulatory Visit: Payer: 59

## 2024-02-13 ENCOUNTER — Encounter: Payer: Self-pay | Admitting: Interventional Cardiology

## 2024-09-11 ENCOUNTER — Ambulatory Visit
Admission: RE | Admit: 2024-09-11 | Discharge: 2024-09-11 | Disposition: A | Discharge status: Home / Self Care | Source: Ambulatory Visit | Attending: Obstetrics and Gynecology | Admitting: Obstetrics and Gynecology

## 2024-09-11 DIAGNOSIS — Z720 Tobacco use: Secondary | ICD-10-CM

## 2024-10-15 NOTE — Progress Notes (Signed)
 Cardiology Office Note:   Date:  10/15/2024  ID:  Brandi Carr, DOB 1966/08/29, MRN [id number] PCP: Merilee [name], MD (Inactive)  [facility] Providers Cardiologist:  Georganna Archer, MD { Chief Complaint:  Chief Complaint  Patient presents with   Follow-up      History of Present Illness:   Brandi Carr is a 58 y.o. female with a PMH of mildly elevated CAC and former tobacco use who presents for follow up.  Previously seen by cardiology in 2024 for CAC score = 18.7.  Patient presents for follow-up.  She reports intermittent dizziness over the past few months that are fleeting.  Usually occurs when she changes position or moves her head quickly.  No vertigo.  Denies chest pain, SOB, PND, orthopnea, swelling, palpitations, syncope.  She is taking all of her medications as prescribed without side effect.   Past Medical History:  Diagnosis Date   Adenomyosis of uterus    Dysplasia of cervix    High cholesterol    Lichen sclerosus    Menopausal symptoms    Menopausal vasomotor syndrome    Osteopenia    Perirectal discomfort    Tobacco user      Studies Reviewed:    EKG:  EKG Interpretation Date/Time:  Thursday October 16 2024 07:38:27 EST Ventricular Rate:  75 PR Interval:  128 QRS Duration:  86 QT Interval:  378 QTC Calculation: 422 R Axis:   46  Text Interpretation: Normal sinus rhythm Normal ECG No previous ECGs available Confirmed by Archer Georganna [id number]) on 10/16/2024 8:08:42 AM         Risk Assessment/Calculations:              Physical Exam:     VS:  BP 112/66   Pulse 75   Ht 5' 3 (1.6 m)   Wt 174 lb (78.9 kg)   SpO2 98%   BMI 30.82 kg/m      Wt Readings from Last 3 Encounters:  03/06/23 183 lb 12.8 oz (83.4 kg)  03/26/19 194 lb (88 kg)     GEN: Well nourished, well developed, in no acute distress NECK: No JVD; No carotid bruits CARDIAC: RRR, no murmurs, rubs, gallops RESPIRATORY:  Clear to auscultation without  rales, wheezing or rhonchi  ABDOMEN: Soft, non-tender, non-distended, normal bowel sounds EXTREMITIES:  Warm and well perfused, no edema; No deformity, 2+ radial pulses PSYCH: Normal mood and affect   Assessment & Plan Coronary artery calcification - Patient with mildly elevated coronary calcium score. -Her mother had an MI, but at an older age.  Apart from this, she does not have many traditional risk factors for ASCVD. -I think any more information to better understand her cardiovascular risk. -I will check a lipoprotein a and high-sensitivity CRP today.  She had recent blood work prior PCP with a lipid panel and she will share these results with me once received. -At the moment, there is not clear evidence that the patient would benefit from a daily baby aspirin  given that her CAC score is only mildly elevated.  She can discontinue the baby aspirin  for now and restart if her LPA is elevated. -Currently she is taking her Crestor 10 mg 3 times weekly.  We can adjust this depending on what her cholesterol profile looks like. Continue Crestor 10 mg MWF for now Patient will send in PCP lipid panel results Check lipoprotein a, HS-CRP Stop baby aspirin  Follow-up in 12 months  This note was written with the assistance of a dictation microphone or AI dictation software. Please excuse any typos or grammatical errors.   Signed, Georganna Archer, MD 10/15/2024 8:02 PM    [facility]

## 2024-10-16 ENCOUNTER — Encounter: Payer: Self-pay | Admitting: Student in an Organized Health Care Education/Training Program

## 2024-10-16 ENCOUNTER — Ambulatory Visit
Attending: Student in an Organized Health Care Education/Training Program | Admitting: Student in an Organized Health Care Education/Training Program

## 2024-10-16 VITALS — BP 112/66 | HR 75 | Ht 63.0 in | Wt 174.0 lb

## 2024-10-16 DIAGNOSIS — Z87891 Personal history of nicotine dependence: Secondary | ICD-10-CM

## 2024-10-16 DIAGNOSIS — I2510 Atherosclerotic heart disease of native coronary artery without angina pectoris: Secondary | ICD-10-CM | POA: Diagnosis not present

## 2024-10-16 DIAGNOSIS — Z6832 Body mass index (BMI) 32.0-32.9, adult: Secondary | ICD-10-CM | POA: Diagnosis not present

## 2024-10-16 DIAGNOSIS — E66811 Obesity, class 1: Secondary | ICD-10-CM | POA: Diagnosis not present

## 2024-10-16 NOTE — Patient Instructions (Signed)
 Medication Instructions:  Stop Aspirin  *If you need a refill on your cardiac medications before your next appointment, please call your pharmacy*  Lab Work: Today- LPA, CRP High Sensitivity  If you have labs (blood work) drawn today and your tests are completely normal, you will receive your results only by: MyChart Message (if you have MyChart) OR A paper copy in the mail If you have any lab test that is abnormal or we need to change your treatment, we will call you to review the results.   Follow-Up: At [facility], you and your health needs are our priority.  As part of our continuing mission to provide you with exceptional heart care, our providers are all part of one team.  This team includes your primary Cardiologist (physician) and Advanced Practice Providers or APPs (Physician Assistants and Nurse Practitioners) who all work together to provide you with the care you need, when you need it.  Your next appointment:   1 year(s)  Provider:   Georganna Archer, MD

## 2024-10-18 LAB — HIGH SENSITIVITY CRP: CRP, High Sensitivity: 3.53 mg/L — ABNORMAL HIGH (ref 0.00–3.00)

## 2024-10-18 LAB — LIPOPROTEIN A (LPA): Lipoprotein (a): 38.7 nmol/L (ref <75.0)

## 2024-10-20 ENCOUNTER — Ambulatory Visit: Payer: Self-pay | Admitting: Student in an Organized Health Care Education/Training Program

## 2024-10-28 NOTE — Telephone Encounter (Signed)
 Please clarify
